# Patient Record
Sex: Female | Born: 1970 | Hispanic: Yes | Marital: Single | Smoking: Former smoker
Health system: Southern US, Community
[De-identification: ages and names within clinical notes are randomized; demographics above are authoritative.]

## PROBLEM LIST (undated history)

## (undated) DIAGNOSIS — M109 Gout, unspecified: Secondary | ICD-10-CM

## (undated) DIAGNOSIS — D649 Anemia, unspecified: Secondary | ICD-10-CM

## (undated) HISTORY — DX: Gout, unspecified: M10.9

## (undated) HISTORY — PX: TUBAL LIGATION: SHX77

## (undated) HISTORY — DX: Anemia, unspecified: D64.9

---

## 2005-10-11 ENCOUNTER — Emergency Department (HOSPITAL_COMMUNITY): Admission: EM | Admit: 2005-10-11 | Discharge: 2005-10-11 | Payer: Self-pay | Admitting: Emergency Medicine

## 2008-10-12 ENCOUNTER — Ambulatory Visit: Payer: Self-pay | Admitting: Internal Medicine

## 2008-10-18 ENCOUNTER — Ambulatory Visit: Payer: Self-pay | Admitting: *Deleted

## 2008-11-02 ENCOUNTER — Encounter: Payer: Self-pay | Admitting: Family Medicine

## 2008-11-02 ENCOUNTER — Ambulatory Visit: Payer: Self-pay | Admitting: Internal Medicine

## 2008-11-02 LAB — CONVERTED CEMR LAB
ALT: 12 units/L (ref 0–35)
AST: 16 units/L (ref 0–37)
Albumin: 4 g/dL (ref 3.5–5.2)
Alkaline Phosphatase: 81 units/L (ref 39–117)
BUN: 17 mg/dL (ref 6–23)
Basophils Absolute: 0 10*3/uL (ref 0.0–0.1)
Basophils Relative: 1 % (ref 0–1)
CO2: 23 meq/L (ref 19–32)
Calcium: 8.7 mg/dL (ref 8.4–10.5)
Chloride: 107 meq/L (ref 96–112)
Cholesterol: 141 mg/dL (ref 0–200)
Creatinine, Ser: 0.61 mg/dL (ref 0.40–1.20)
Eosinophils Absolute: 0.1 10*3/uL (ref 0.0–0.7)
Eosinophils Relative: 1 % (ref 0–5)
Glucose, Bld: 97 mg/dL (ref 70–99)
HCT: 36.4 % (ref 36.0–46.0)
HDL: 58 mg/dL (ref >39)
Hemoglobin: 11.6 g/dL — ABNORMAL LOW (ref 12.0–15.0)
LDL Cholesterol: 70 mg/dL (ref 0–99)
Lymphocytes Relative: 32 % (ref 12–46)
Lymphs Abs: 2.4 10*3/uL (ref 0.7–4.0)
MCHC: 31.9 g/dL (ref 30.0–36.0)
MCV: 88.1 fL (ref 78.0–100.0)
Monocytes Absolute: 0.6 10*3/uL (ref 0.1–1.0)
Monocytes Relative: 7 % (ref 3–12)
Neutro Abs: 4.4 10*3/uL (ref 1.7–7.7)
Neutrophils Relative %: 59 % (ref 43–77)
Platelets: 317 10*3/uL (ref 150–400)
Potassium: 4.1 meq/L (ref 3.5–5.3)
RBC: 4.13 M/uL (ref 3.87–5.11)
RDW: 14.2 % (ref 11.5–15.5)
Sodium: 139 meq/L (ref 135–145)
TSH: 0.657 microintl units/mL (ref 0.350–4.50)
Total Bilirubin: 0.4 mg/dL (ref 0.3–1.2)
Total CHOL/HDL Ratio: 2.4
Total Protein: 6.9 g/dL (ref 6.0–8.3)
Triglycerides: 66 mg/dL (ref <150)
VLDL: 13 mg/dL (ref 0–40)
WBC: 7.4 10*3/uL (ref 4.0–10.5)

## 2008-12-04 ENCOUNTER — Ambulatory Visit: Payer: Self-pay | Admitting: Family Medicine

## 2009-04-12 ENCOUNTER — Ambulatory Visit: Payer: Self-pay | Admitting: Internal Medicine

## 2015-07-18 ENCOUNTER — Emergency Department (HOSPITAL_COMMUNITY)
Admission: EM | Admit: 2015-07-18 | Discharge: 2015-07-18 | Disposition: A | Discharge status: Home / Self Care | Payer: Self-pay | Attending: Emergency Medicine | Admitting: Emergency Medicine

## 2015-07-18 ENCOUNTER — Encounter (HOSPITAL_COMMUNITY): Payer: Self-pay | Admitting: Emergency Medicine

## 2015-07-18 DIAGNOSIS — H6591 Unspecified nonsuppurative otitis media, right ear: Secondary | ICD-10-CM | POA: Insufficient documentation

## 2015-07-18 DIAGNOSIS — H65192 Other acute nonsuppurative otitis media, left ear: Secondary | ICD-10-CM | POA: Insufficient documentation

## 2015-07-18 DIAGNOSIS — J0100 Acute maxillary sinusitis, unspecified: Secondary | ICD-10-CM | POA: Insufficient documentation

## 2015-07-18 DIAGNOSIS — R0981 Nasal congestion: Secondary | ICD-10-CM | POA: Insufficient documentation

## 2015-07-18 MED ORDER — OXYCODONE-ACETAMINOPHEN 5-325 MG PO TABS
ORAL_TABLET | ORAL | Status: AC
  Filled 2015-07-18: qty 1

## 2015-07-18 MED ORDER — AMOXICILLIN-POT CLAVULANATE 875-125 MG PO TABS: 1.0000 | ORAL_TABLET | Freq: Two times a day (BID) | ORAL | Status: DC

## 2015-07-18 MED ORDER — PSEUDOEPHEDRINE HCL ER 120 MG PO TB12: 120.0000 mg | ORAL_TABLET | Freq: Two times a day (BID) | ORAL | Status: DC

## 2015-07-18 MED ORDER — OXYCODONE-ACETAMINOPHEN 5-325 MG PO TABS
1.0000 | ORAL_TABLET | Freq: Once | ORAL | Status: AC
  Administered 2015-07-18: 1 via ORAL

## 2015-07-18 MED ORDER — AMOXICILLIN-POT CLAVULANATE 875-125 MG PO TABS
1.0000 | ORAL_TABLET | Freq: Once | ORAL | Status: AC
  Administered 2015-07-18: 1 via ORAL
  Filled 2015-07-18: qty 1

## 2015-07-18 MED ORDER — PSEUDOEPHEDRINE HCL ER 120 MG PO TB12
120.0000 mg | ORAL_TABLET | Freq: Once | ORAL | Status: AC
  Administered 2015-07-18: 120 mg via ORAL
  Filled 2015-07-18: qty 1

## 2015-07-18 NOTE — ED Notes (Signed)
Pt. reports left ear ache onset yesterday , denies injury , no drainage / no hearing loss.

## 2015-07-18 NOTE — ED Provider Notes (Signed)
CSN: [id number]     Arrival date & time 07/18/15  0155 History   First MD Initiated Contact with Patient 07/18/15 [phone]     Chief Complaint  Patient presents with  . Otalgia     (Consider location/radiation/quality/duration/timing/severity/associated sxs/prior Treatment) HPI Comments: Yesterday patient started with nasal congestion, more prominent on the left than right.  Left ear pain.  She has taken Tylenol without any relief.  Denies any fever or sore throat  Patient is a 44 y.o. female presenting with ear pain. The history is provided by the patient. The history is limited by a language barrier. A language interpreter was used.  Otalgia Location:  Right Behind ear:  No abnormality Quality:  Aching Severity:  Moderate Onset quality:  Gradual Duration:  1 day Timing:  Constant Progression:  Worsening Chronicity:  New Relieved by:  Nothing Worsened by:  Nothing tried Ineffective treatments:  None tried Associated symptoms: congestion   Associated symptoms: no ear discharge, no fever, no headaches, no hearing loss, no neck pain, no rash, no rhinorrhea, no sore throat and no tinnitus   Congestion:    Location:  Nasal   History reviewed. No pertinent past medical history. History reviewed. No pertinent past surgical history. No family history on file. History  Substance Use Topics  . Smoking status: Never Smoker   . Smokeless tobacco: Not on file  . Alcohol Use: No   OB History    No data available     Review of Systems  Constitutional: Negative for fever.  HENT: Positive for congestion, ear pain and sinus pressure. Negative for ear discharge, facial swelling, hearing loss, rhinorrhea, sore throat and tinnitus.   Musculoskeletal: Negative for neck pain.  Skin: Negative for rash.  Neurological: Negative for headaches.  All other systems reviewed and are negative.     Allergies  Review of patient's allergies indicates no known allergies.  Home Medications    Prior to Admission medications   Medication Sig Start Date End Date Taking? Authorizing Provider  amoxicillin-clavulanate (AUGMENTIN) 875-125 MG per tablet Take 1 tablet by mouth 2 (two) times daily. 07/18/15   Junius Creamer, NP  pseudoephedrine (SUDAFED) 120 MG 12 hr tablet Take 1 tablet (120 mg total) by mouth 2 (two) times daily. 07/18/15   Junius Creamer, NP   BP 146/72 mmHg  Pulse 103  Temp(Src) 99.2 F (37.3 C) (Oral)  Resp 20  SpO2 99%  LMP 07/15/2015 Physical Exam  Constitutional: She appears well-developed and well-nourished. No distress.  HENT:  Head: Normocephalic and atraumatic.  Right Ear: External ear normal. Tympanic membrane is bulging. A middle ear effusion is present.  Nose: No rhinorrhea. Left sinus exhibits maxillary sinus tenderness and frontal sinus tenderness.  Cardiovascular: Normal rate.   Musculoskeletal: Normal range of motion.  Lymphadenopathy:    She has no cervical adenopathy.  Neurological: She is alert.  Skin: Skin is warm and dry.  Nursing note and vitals reviewed.   ED Course  Procedures (including critical care time) Labs Review Labs Reviewed - No data to display  Imaging Review No results found.   EKG Interpretation None     patient has left frontal and maxillary sinus tenderness and left otitis will be treated with Augmentin and Sudafed  MDM   Final diagnoses:  Acute maxillary sinusitis, recurrence not specified  Other acute nonsuppurative otitis media of left ear           Junius Creamer, NP 07/18/15 2585  Linton Flemings, MD 07/18/15 [phone]

## 2015-07-20 ENCOUNTER — Ambulatory Visit (INDEPENDENT_AMBULATORY_CARE_PROVIDER_SITE_OTHER): Payer: Self-pay | Admitting: Urgent Care

## 2015-07-20 VITALS — BP 106/74 | HR 96 | Temp 99.0°F | Resp 18 | Ht 62.0 in | Wt 165.0 lb

## 2015-07-20 DIAGNOSIS — H60392 Other infective otitis externa, left ear: Secondary | ICD-10-CM

## 2015-07-20 DIAGNOSIS — H60399 Other infective otitis externa, unspecified ear: Secondary | ICD-10-CM | POA: Insufficient documentation

## 2015-07-20 DIAGNOSIS — H9202 Otalgia, left ear: Secondary | ICD-10-CM

## 2015-07-20 MED ORDER — CIPROFLOXACIN HCL 500 MG PO TABS
500.0000 mg | ORAL_TABLET | Freq: Two times a day (BID) | ORAL | Status: DC
Start: 2015-07-20 — End: 2016-05-08

## 2015-07-20 MED ORDER — NEOMYCIN-POLYMYXIN-HC 3.5-10000-1 OT SOLN: 3.0000 [drp] | Freq: Four times a day (QID) | OTIC | Status: DC

## 2015-07-20 NOTE — Progress Notes (Signed)
    MRN: [id number] DOB: 09-26-1971  Subjective:   Madison Thompson is a 44 y.o. female presenting for chief complaint of Ear Pain  Reports ~1 week history of left ear pain. Patient was seen at [facility] ED on 07/18/2015, diagnosed with sinusitis and otitis media, started on Augmentin and APAP-cod for pain and inflammation. Her left ear pain has since gotten worse and is draining, has left sided neck pain and swollen lymph nodes, external ear pain, started having nausea yesterday. Of note, patient went swimming at a pool ~1.5-2 weeks ago and ear pain started shortly thereafter. Denies fever, sore throat, cough, eye pain, tooth pain. Denies any other aggravating or relieving factors, no other questions or concerns.  Madison Thompson has a current medication list which includes the following prescription(s): acetaminophen-codeine, amoxicillin-clavulanate, and pseudoephedrine. She has No Known Allergies.  Madison Thompson  has a past medical history of Anemia. Also  has no past surgical history on file.  ROS As in subjective.  Objective:   Vitals: BP 106/74 mmHg  Pulse 96  Temp(Src) 99 F (37.2 C)  Resp 18  Ht 5\' 2"  (1.575 m)  Wt 165 lb (74.844 kg)  BMI 30.17 kg/m2  SpO2 98%  LMP 07/15/2015  Physical Exam  Constitutional: She appears well-developed and well-nourished.  HENT:  Left ear canal with white clumps of discharge, effusion present but TM without erythema. Nasal turbinates pink and moist. Left maxillary sinus tenderness. Throat without oropharyngeal exudates, erythema or abscesses. Dentition in good repair. No areas of fluctuance or swelling along gumlines or signs of infection of teeth.  Neck: Normal range of motion. Neck supple.  Cardiovascular: Normal rate, regular rhythm and intact distal pulses.  Exam reveals no gallop and no friction rub.   No murmur heard. Pulmonary/Chest: No respiratory distress. She has no wheezes. She has no rales.  Lymphadenopathy:    She has cervical  adenopathy (anterior, left-sided, tender on palpation).  Skin: Skin is warm and dry. No rash noted. No erythema. No pallor.    Assessment and Plan :   1. Left ear pain 2. Otitis, externa, infective, left - Will cover patient for otitis externa with Ciprofloxacin x10 days and cortisporin ear drops (QID x7 days). Patient is rtc in 2 days for follow up. Consider mastoiditis, CT scan if no improvement, may also refer to ENT for more emergent evaluation.  Jaynee Eagles, PA-C [facility] [phone] 07/20/2015 12:18 PM

## 2015-07-20 NOTE — Patient Instructions (Signed)
Otitis externa (Otitis Externa) La otitis externa es una infeccin bacteriana o por hongos en el conducto auditivo externo. Esta es el rea desde el tmpano hasta el exterior de la [place]. Tambin se llama "odo de nadador". CAUSAS  Las posibles causas de la infeccin son:  Tonye Becket en agua sucia.  Humedad que queda en el odo despus de nadar o baarse.  Lesin leve en el odo (traumatismo).  Objetos atascados en el odo (cuerpo extrao).  Cortes o raspones (abrasiones) en la parte exterior del odo. SIGNOS Y SNTOMAS  En general, el primer sntoma de infeccin es la picazn en el canal auditivo. Ms tarde, los signos y los sntomas pueden ser hinchazn y enrojecimiento del conducto auditivo, dolor de odo y supuracin de lquido de color blanco amarillento (pus). El Social research officer, government de odo puede empeorar cuando tira el lbulo de la [place]. DIAGNSTICO  Su mdico le Chartered certified accountant examen fsico. Podr tomar Truddie Coco de lquido de la oreja y Hydrographic surveyor bacterias u hongos. TRATAMIENTO  Las gotas antibiticas para los odos se administran generalmente durante 10 a 14 das. El tratamiento tambin puede incluir analgsicos o corticoides para reducir la comezn y la hinchazn. INSTRUCCIONES PARA EL CUIDADO EN EL HOGAR   Aplique gotas de antibitico en el conducto auditivo segn lo indicado por el mdico.  Tome los medicamentos solamente como se lo haya indicado el mdico.  Si tiene diabetes, siga las instrucciones adicionales de tratamiento que le haya dado el mdico.  Consulting civil engineer a todas las visitas de control como se lo haya indicado el mdico. PREVENCIN   Mantenga el odo seco. Use la punta de una toalla para absorber el agua del canal auditivo despus de nadar o del bao.  Evite rascarse o poner objetos en el interior del odo. Esto puede daar el conducto auditivo o eliminar la cera protectora que recubre el conducto. Esto facilita el crecimiento de las bacterias y hongos.  [employer], en agua contaminada, o en las piscinas mal cloradas.  Puede usar gotas para los odos hechas de alcohol y vinagre despus de nadar. Mezcle en partes iguales el vinagre blanco y el alcohol en una botella. Ponga 3 o 4 gotas en cada odo despus de nadar. SOLICITE ATENCIN MDICA SI:   Jaclynn Guarneri.  Su odo contina rojo, hinchado, le duele o supura pus despus de 3 das.  El dolor, la hinchazn o el enrojecimiento empeoran.  Sufre un dolor intenso de [place].  Tiene en la zona detrs de la oreja roja, hinchada, le duele o est sensible. ASEGRESE DE QUE:   Comprende estas instrucciones.  Controlar su afeccin.  Recibir ayuda de inmediato si no mejora o si empeora. Document Released: 12/15/2005 Document Revised: 05/01/2014 [facility] Patient Information 2015 [place], [place]. This information is not intended to replace advice given to you by your health care provider. Make sure you discuss any questions you have with your health care provider.

## 2016-05-08 ENCOUNTER — Ambulatory Visit (INDEPENDENT_AMBULATORY_CARE_PROVIDER_SITE_OTHER): Payer: Self-pay | Admitting: Physician Assistant

## 2016-05-08 ENCOUNTER — Ambulatory Visit (INDEPENDENT_AMBULATORY_CARE_PROVIDER_SITE_OTHER): Payer: Self-pay

## 2016-05-08 VITALS — BP 126/68 | HR 74 | Temp 98.4°F | Resp 16 | Ht 61.0 in | Wt 157.8 lb

## 2016-05-08 DIAGNOSIS — M542 Cervicalgia: Secondary | ICD-10-CM

## 2016-05-08 DIAGNOSIS — R202 Paresthesia of skin: Secondary | ICD-10-CM

## 2016-05-08 DIAGNOSIS — Z3202 Encounter for pregnancy test, result negative: Secondary | ICD-10-CM

## 2016-05-08 LAB — POCT CBC
Granulocyte percent: 60.8 %G (ref 37–80)
HCT, POC: 31.3 % — AB (ref 37.7–47.9)
Hemoglobin: 10.5 g/dL — AB (ref 12.2–16.2)
Lymph, poc: 3.1 (ref 0.6–3.4)
MCH, POC: 25.6 pg — AB (ref 27–31.2)
MCHC: 33.5 g/dL (ref 31.8–35.4)
MCV: 76.4 fL — AB (ref 80–97)
MID (cbc): 0.8 (ref 0–0.9)
MPV: 7.4 fL (ref 0–99.8)
POC Granulocyte: 6 (ref 2–6.9)
POC LYMPH PERCENT: 31.2 %L (ref 10–50)
POC MID %: 8 %M (ref 0–12)
Platelet Count, POC: 318 10*3/uL (ref 142–424)
RBC: 4.1 M/uL (ref 4.04–5.48)
RDW, POC: 16 %
WBC: 9.9 10*3/uL (ref 4.6–10.2)

## 2016-05-08 LAB — POCT GLYCOSYLATED HEMOGLOBIN (HGB A1C): Hemoglobin A1C: 5.5

## 2016-05-08 LAB — POCT URINE PREGNANCY: Preg Test, Ur: NEGATIVE

## 2016-05-08 MED ORDER — PREDNISONE 20 MG PO TABS: ORAL_TABLET | ORAL | Status: AC

## 2016-05-08 MED ORDER — CYCLOBENZAPRINE HCL 10 MG PO TABS: 5.0000 mg | ORAL_TABLET | Freq: Three times a day (TID) | ORAL | Status: DC | PRN

## 2016-05-08 NOTE — Patient Instructions (Signed)
     IF you received an x-ray today, you will receive an invoice from [facility]. Please contact [facility] at [phone] with questions or concerns regarding your invoice.   IF you received labwork today, you will receive an invoice from [facility]. Please contact [facility] at [phone] with questions or concerns regarding your invoice.   Our billing staff will not be able to assist you with questions regarding bills from these companies.  You will be contacted with the lab results as soon as they are available. The fastest way to get your results is to activate your My Chart account. Instructions are located on the last page of this paperwork. If you have not heard from us regarding the results in 2 weeks, please contact this office.      

## 2016-05-08 NOTE — Progress Notes (Signed)
05/14/2016 11:31 AM   DOB: [birth year] / MRN: [id number]  SUBJECTIVE:  Madison Thompson is a 45 y.o. female presenting for back pain and neck that is worsening. She describes the pain as "strong" and she is unable to lift her head.  She complains that her right hand is hurting some today.  She denies weakness in her arms. She denies weakness and numbness in her legs. She has had these symptoms in the past and reports she was told she had "too much stress" and was not given any medication.    She has No Known Allergies.   She  has a past medical history of Anemia.    She  reports that she has never smoked. She does not have any smokeless tobacco history on file. She reports that she does not drink alcohol or use illicit drugs. She  has no sexual activity history on file. The patient  has no past surgical history on file.  Her family history is not on file.  Review of Systems  Constitutional: Negative for fever and chills.  Respiratory: Negative for cough.   Cardiovascular: Negative for chest pain.  Gastrointestinal: Negative for nausea.  Musculoskeletal: Positive for myalgias and neck pain. Negative for back pain, joint pain and falls.  Skin: Negative for rash.  Neurological: Positive for headaches. Negative for dizziness.    Problem list and medications reviewed and updated by myself where necessary, and exist elsewhere in the encounter.   OBJECTIVE:  BP 126/68 mmHg  Pulse 74  Temp(Src) 98.4 F (36.9 C) (Oral)  Resp 16  Ht 5\' 1"  (1.549 m)  Wt 157 lb 12.8 oz (71.578 kg)  BMI 29.83 kg/m2  SpO2 98%  LMP 03/08/2016  Physical Exam  Neurological: She has normal strength. She displays no atrophy and no tremor. A sensory deficit is present. No cranial nerve deficit. She exhibits normal muscle tone. She displays a negative Romberg sign. Coordination and gait normal. GCS eye subscore is 4. GCS verbal subscore is 5. GCS motor subscore is 6.  Reflex Scores:      Tricep reflexes are  2+ on the right side and 2+ on the left side.      Bicep reflexes are 2+ on the right side and 2+ on the left side.      Brachioradialis reflexes are 2+ on the right side and 2+ on the left side.      Patellar reflexes are 2+ on the right side and 2+ on the left side.      Achilles reflexes are 2+ on the right side and 2+ on the left side. Decreased sensation to light touch about the C6-C7 dermatome.     No results found for this or any previous visit (from the past 72 hour(s)).  No results found.  ASSESSMENT AND PLAN  Madison Thompson was seen today for neck and left shoulder pain and headache.  Diagnoses and all orders for this visit:  Neck pain -     DG Cervical Spine Complete; Future -     POCT urine pregnancy -     cyclobenzaprine (FLEXERIL) 10 MG tablet; Take 0.5-1 tablets (5-10 mg total) by mouth 3 (three) times daily as needed for muscle spasms (May cause drowsiness. Do no operate heavy machinery while taking.).  Paresthesia of right arm -     POCT glycosylated hemoglobin (Hb A1C) -     POCT CBC -     predniSONE (DELTASONE) 20 MG tablet; Take 3 in the morning for  3 days, then 2 in the morning for 3 days, and then 1 in the morning for 3 days.    The patient was advised to call or return to clinic if she does not see an improvement in symptoms or to seek the care of the closest emergency department if she worsens with the above plan.   Philis Fendt, MHS, PA-C [facility] 05/14/2016 11:31 AM

## 2016-12-11 ENCOUNTER — Other Ambulatory Visit: Payer: Self-pay | Admitting: Obstetrics and Gynecology

## 2016-12-11 DIAGNOSIS — Z1231 Encounter for screening mammogram for malignant neoplasm of breast: Secondary | ICD-10-CM

## 2017-01-06 ENCOUNTER — Ambulatory Visit
Admission: RE | Admit: 2017-01-06 | Discharge: 2017-01-06 | Disposition: A | Discharge status: Home / Self Care | Payer: No Typology Code available for payment source | Source: Ambulatory Visit | Attending: Obstetrics and Gynecology | Admitting: Obstetrics and Gynecology

## 2017-01-06 ENCOUNTER — Ambulatory Visit (HOSPITAL_COMMUNITY)
Admission: RE | Admit: 2017-01-06 | Discharge: 2017-01-06 | Disposition: A | Discharge status: Home / Self Care | Payer: Self-pay | Source: Ambulatory Visit | Attending: Obstetrics and Gynecology | Admitting: Obstetrics and Gynecology

## 2017-01-06 ENCOUNTER — Encounter (HOSPITAL_COMMUNITY): Payer: Self-pay

## 2017-01-06 VITALS — BP 118/78 | Temp 97.7°F | Ht 63.0 in | Wt 154.2 lb

## 2017-01-06 DIAGNOSIS — Z1239 Encounter for other screening for malignant neoplasm of breast: Secondary | ICD-10-CM

## 2017-01-06 DIAGNOSIS — Z1231 Encounter for screening mammogram for malignant neoplasm of breast: Secondary | ICD-10-CM

## 2017-01-06 NOTE — Progress Notes (Signed)
No complaints today.   Pap Smear: Pap smear not completed today. Last Pap smear was more than 10 years ago and normal per patient. Unable to complete Pap smear since patient is currently on menstrual period. Pap smear is scheduled with [facility] clinic on Thursday, January 22, 2017 at 1530. Per patient has no history of an abnormal Pap smear. No Pap smear results are in EPIC.  Physical exam: Breasts Breasts symmetrical. No skin abnormalities bilateral breasts. Bilateral nipple retraction that per patient is normal for her. No nipple discharge bilateral breasts. No lymphadenopathy. No lumps palpated bilateral breasts. No complaints of pain or tenderness on exam. Referred patient to the [facility] for a screening mammogram. Appointment scheduled for Tuesday, January 06, 2017 at 1400.        Pelvic/Bimanual No Pap smear completed today since last patient is currently on her menstrual period.  Smoking History: Patient has never smoked.  Patient Navigation: Patient education provided. Access to services provided for patient through [facility] program. Spanish interpreter provided.  Used Spanish interpreter Scarlette Calico from [employer].

## 2017-01-06 NOTE — Patient Instructions (Signed)
Explained breast self awareness with Maricella Pereira. Unable to complete Pap smear since patient is currently on menstrual period. Pap smear is scheduled with [facility] clinic on Thursday, January 22, 2017 at 1530. Referred patient to the [facility] for a screening mammogram. Appointment scheduled for Tuesday, January 06, 2017 at 1400. Let [facility] know the [facility] will follow up with her within the next couple weeks with results of mammogram by letter or phone. [place] verbalized understanding.  Valine Drozdowski, Arvil Chaco, RN 2:13 PM

## 2017-01-07 ENCOUNTER — Encounter (HOSPITAL_COMMUNITY): Payer: Self-pay | Admitting: *Deleted

## 2017-01-22 ENCOUNTER — Encounter (HOSPITAL_COMMUNITY): Payer: Self-pay

## 2017-01-22 ENCOUNTER — Ambulatory Visit (HOSPITAL_COMMUNITY)
Admission: RE | Admit: 2017-01-22 | Discharge: 2017-01-22 | Disposition: A | Discharge status: Home / Self Care | Payer: Self-pay | Source: Ambulatory Visit | Attending: Obstetrics and Gynecology | Admitting: Obstetrics and Gynecology

## 2017-01-22 VITALS — BP 120/78 | Temp 97.9°F | Ht 63.0 in | Wt 158.0 lb

## 2017-01-22 DIAGNOSIS — Z01419 Encounter for gynecological examination (general) (routine) without abnormal findings: Secondary | ICD-10-CM

## 2017-01-22 DIAGNOSIS — N898 Other specified noninflammatory disorders of vagina: Secondary | ICD-10-CM

## 2017-01-22 NOTE — Patient Instructions (Signed)
Explained to [facility] that [facility] will cover Pap smears and HPV typing every 5 years unless has a history of abnormal Pap smears. Reminded patient that she will need a screening mammogram in one year. Let patient know will follow up with her within the next couple days with wet prep results and within a couple of weeks with results to Pap smear by phone. [place] verbalized understanding.  Lauralie Blacksher, Arvil Chaco, RN 3:31 PM

## 2017-01-22 NOTE — Progress Notes (Signed)
No complaints today.   Pap Smear: Pap smear not completed today. Last Pap smear was more than 10 years ago and normal per patient. Per patient has no history of an abnormal Pap smear. No Pap smear results are in EPIC  Pelvic/Bimanual   Ext Genitalia No lesions, no swelling and no discharge observed on external genitalia.         Vagina Vagina pink and normal texture. No lesions and thick white vaginal discharge observed in vagina. Wet prep completed.         Cervix Cervix is present. Cervix pink and of normal texture. Cervix friable. Thick white discharge observed on cervix.     Uterus Uterus is present and palpable. Uterus in normal position and normal size.        Adnexae Bilateral ovaries present and palpable. No tenderness on palpation.          Rectovaginal No rectal exam completed today since patient had no rectal complaints. No skin abnormalities observed on exam.    Smoking History: Patient has never smoked.  Patient Navigation: Patient education provided. Access to services provided for patient through [facility] program. Spanish interpreter provided.  Used Spanish interpreter [employer] from [place].

## 2017-01-23 ENCOUNTER — Other Ambulatory Visit (HOSPITAL_COMMUNITY): Payer: Self-pay | Admitting: *Deleted

## 2017-01-23 ENCOUNTER — Telehealth (HOSPITAL_COMMUNITY): Payer: Self-pay | Admitting: *Deleted

## 2017-01-23 ENCOUNTER — Encounter (HOSPITAL_COMMUNITY): Payer: Self-pay | Admitting: *Deleted

## 2017-01-23 DIAGNOSIS — B9689 Other specified bacterial agents as the cause of diseases classified elsewhere: Secondary | ICD-10-CM

## 2017-01-23 DIAGNOSIS — N760 Acute vaginitis: Principal | ICD-10-CM

## 2017-01-23 LAB — CYTOLOGY - PAP
Diagnosis: NEGATIVE
HPV: NOT DETECTED

## 2017-01-23 MED ORDER — METRONIDAZOLE 500 MG PO TABS: 500.0000 mg | ORAL_TABLET | Freq: Two times a day (BID) | ORAL | 0 refills | Status: DC

## 2017-01-23 NOTE — Telephone Encounter (Signed)
Telephoned patient at home number. Advised patient wet prep results did show bacterial vaginosis. Medication was called into [employer] at [facility]. Advised patient to finish all medication and no alcohol while taking medication. Patient voiced understanding. Used interpreter Lavon Paganini.

## 2017-01-27 ENCOUNTER — Telehealth (HOSPITAL_COMMUNITY): Payer: Self-pay | Admitting: *Deleted

## 2017-01-27 NOTE — Telephone Encounter (Signed)
Telephoned patient at home number and advised patient of negative pap smear results. HPV was negative. Next pap smear due in five years. Patient voiced understanding. Patient voiced understanding. Used interpreter Lavon Paganini.

## 2018-01-01 ENCOUNTER — Other Ambulatory Visit: Payer: Self-pay | Admitting: Obstetrics and Gynecology

## 2018-01-01 DIAGNOSIS — Z1231 Encounter for screening mammogram for malignant neoplasm of breast: Secondary | ICD-10-CM

## 2018-01-26 ENCOUNTER — Ambulatory Visit (HOSPITAL_COMMUNITY)
Admission: RE | Admit: 2018-01-26 | Discharge: 2018-01-26 | Disposition: A | Discharge status: Home / Self Care | Payer: No Typology Code available for payment source | Source: Ambulatory Visit | Attending: Obstetrics and Gynecology | Admitting: Obstetrics and Gynecology

## 2018-01-26 ENCOUNTER — Ambulatory Visit
Admission: RE | Admit: 2018-01-26 | Discharge: 2018-01-26 | Disposition: A | Discharge status: Home / Self Care | Payer: No Typology Code available for payment source | Source: Ambulatory Visit | Attending: Obstetrics and Gynecology | Admitting: Obstetrics and Gynecology

## 2018-01-26 ENCOUNTER — Encounter (HOSPITAL_COMMUNITY): Payer: Self-pay

## 2018-01-26 VITALS — BP 122/78 | Wt 161.0 lb

## 2018-01-26 DIAGNOSIS — Z1231 Encounter for screening mammogram for malignant neoplasm of breast: Secondary | ICD-10-CM

## 2018-01-26 DIAGNOSIS — Z1239 Encounter for other screening for malignant neoplasm of breast: Secondary | ICD-10-CM

## 2018-01-26 NOTE — Progress Notes (Signed)
No complaints today.   Pap Smear:  Pap smear not completed today. Last Pap smear was 01/22/17 at [facility] and normal with negative HPV. Per patient has no history of an abnormal Pap smear.  Physical exam: Breasts Breasts symmetrical. No skin abnormalities bilateral breasts. Bilateral nipple retraction that per patient is normal for her. No nipple discharge bilateral breasts. No lymphadenopathy. No lumps palpated bilateral breasts. No complaints of pain or tenderness on exam. Referred patient to the [facility] for a screening mammogram. Appointment scheduled for Tuesday, January 26, 2018 at 1600.       Pelvic/Bimanual No Pap smear completed today since last Pap smear and HPV typing was 01/22/2017. Pap smear not indicated per [facility] guidelines.   Smoking History: Patient has never smoked.  Patient Navigation: Patient education provided. Access to services provided for patient through [facility] program. Spanish interpreter provided.  Used Spanish interpreter [employer] from [place].

## 2018-01-26 NOTE — Patient Instructions (Signed)
Explained breast self awareness with [facility]. Patient did not need a Pap smear today due to last Pap smear and HPV typing was 01/22/2017. Let her know [facility] will cover Pap smears and HPV typing every 5 years unless has a history of abnormal Pap smears. Referred patient to the [facility] for a screening mammogram. Appointment scheduled for Tuesday, January 26, 2018 at 1600. Patient aware of appointment and will be there. Let patient know the [facility] will follow up with her within the next couple weeks with results of mammogram by letter or phone. [place] verbalized understanding.  Abhi Moccia, Arvil Chaco, RN 2:15 PM

## 2018-01-27 ENCOUNTER — Encounter (HOSPITAL_COMMUNITY): Payer: Self-pay | Admitting: *Deleted

## 2019-12-30 DIAGNOSIS — U071 COVID-19: Secondary | ICD-10-CM

## 2019-12-30 HISTORY — DX: COVID-19: U07.1

## 2021-02-28 ENCOUNTER — Encounter: Payer: Self-pay | Admitting: Nurse Practitioner

## 2021-03-11 ENCOUNTER — Ambulatory Visit (INDEPENDENT_AMBULATORY_CARE_PROVIDER_SITE_OTHER): Payer: Self-pay | Admitting: Obstetrics & Gynecology

## 2021-03-11 ENCOUNTER — Other Ambulatory Visit: Payer: Self-pay

## 2021-03-11 ENCOUNTER — Encounter: Payer: Self-pay | Admitting: Obstetrics & Gynecology

## 2021-03-11 VITALS — BP 120/84 | Ht 61.0 in | Wt 173.0 lb

## 2021-03-11 DIAGNOSIS — R1900 Intra-abdominal and pelvic swelling, mass and lump, unspecified site: Secondary | ICD-10-CM

## 2021-03-11 NOTE — Progress Notes (Signed)
    [place] 05-20-1971 [id number]        50 y.o.  [id number]   RP: Pelvic Mass on Pelvic US  HPI: Abdominopelvic pressure.  Presented because had difficulty controlling her urine.  Better on medication.  Sexually active, occasionally having discomfort with IC.   OB History  Gravida Para Term Preterm AB Living  3 3 3     3   SAB IAB Ectopic Multiple Live Births          3    # Outcome Date GA Lbr Len/2nd Weight Sex Delivery Anes PTL Lv  3 Term           2 Term           1 Term             Past medical history,surgical history, problem list, medications, allergies, family history and social history were all reviewed and documented in the EPIC chart.   Directed ROS with pertinent positives and negatives documented in the history of present illness/assessment and plan.  Exam:  Vitals:   03/11/21 1051  BP: 120/84  Weight: 173 lb (78.5 kg)  Height: 5\' 1"  (1.549 m)   General appearance:  Normal  Abdomen: Normal, not distended, NT.  Gynecologic exam: Vulva normal.  Bimanual exam:  Uterus increased in volume, nodular, mobile.  No adnexal mass felt, NT.  Pelvic US on February 11, 2021: 11.3 x 9.4 x 8.6 cm complicated solid mass likely arising from the posterior uterus.  Left ovary normal.  Right ovary not visualized.  No other pelvic findings reported.   Assessment/Plan:  50 y.o. G3P3003   1. 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  Schedule abdominopelvic MRI with contrast.  Will do tumor markers as well.  F/U in 2 weeks. - Lactate dehydrogenase - CA 125 - [facility] - MR ANGIO ABDOMEN PELVIS W CONTRAST; Future  Other orders - oxybutynin (DITROPAN) 5 MG tablet; Take 5 mg by mouth 3 (three) times daily.  Princess Bruins MD, 11:29 AM 03/11/2021

## 2021-03-12 ENCOUNTER — Telehealth: Payer: Self-pay | Admitting: *Deleted

## 2021-03-12 DIAGNOSIS — R1900 Intra-abdominal and pelvic swelling, mass and lump, unspecified site: Secondary | ICD-10-CM

## 2021-03-12 LAB — CA 125: CA 125: 15 U/mL (ref <35)

## 2021-03-12 LAB — LACTATE DEHYDROGENASE: LDH: 139 U/L (ref 120–250)

## 2021-03-12 LAB — CEA: CEA: <0.5 ng/mL

## 2021-03-12 NOTE — Telephone Encounter (Signed)
-----   Message from Princess Bruins, MD sent at 03/11/2021 11:35 AM EDT ----- Regarding: Schedule MRI of abdomen and pelvis Large heterogeneous mass 9.4 x 8.6 x 11.3 cm from or adjacent to the posterior uterus.  Rt Ovary not seen by [place].  MRI of abdomen and pelvis asap  F/U scheduled with me in 2 weeks

## 2021-03-12 NOTE — Telephone Encounter (Signed)
I called [facility] and regarding scheduling they have to speak with patient to discuss screening questions. Spanish interpreter will call to schedule.

## 2021-03-18 ENCOUNTER — Encounter: Payer: Self-pay | Admitting: Obstetrics & Gynecology

## 2021-03-20 NOTE — Telephone Encounter (Signed)
[facility] has left message for patient to call with interpreter

## 2021-03-21 NOTE — Telephone Encounter (Signed)
Dr.Lavoie patient MRI's scheduled on 04/08/21 & 04/11/21 they have to do 2 separate Mri's due to abdomen and pelvis your ordered.  You wanted patient to follow up with you in 2 weeks, and she is scheduled on 04/05/21. This is the next available appointment based [place] and patient work schedule.  Should we reschedule her follow up visit until after the MRI scan?

## 2021-03-22 NOTE — Telephone Encounter (Signed)
Yes, schedule visit with me after 4/14th.

## 2021-03-22 NOTE — Telephone Encounter (Signed)
Appointment rescheduled for April 27.

## 2021-03-22 NOTE — Telephone Encounter (Signed)
Rosemarie Ax can you call and reschedule the 04/05/21 appointment for patient until after 04/11/21 per the below.

## 2021-04-05 ENCOUNTER — Ambulatory Visit: Payer: Self-pay | Admitting: Obstetrics & Gynecology

## 2021-04-08 ENCOUNTER — Ambulatory Visit
Admission: RE | Admit: 2021-04-08 | Discharge: 2021-04-08 | Disposition: A | Discharge status: Home / Self Care | Payer: No Typology Code available for payment source | Source: Ambulatory Visit | Attending: Obstetrics & Gynecology | Admitting: Obstetrics & Gynecology

## 2021-04-08 ENCOUNTER — Other Ambulatory Visit: Payer: Self-pay

## 2021-04-08 DIAGNOSIS — R1900 Intra-abdominal and pelvic swelling, mass and lump, unspecified site: Secondary | ICD-10-CM

## 2021-04-08 MED ORDER — GADOBENATE DIMEGLUMINE 529 MG/ML IV SOLN
16.0000 mL | Freq: Once | INTRAVENOUS | Status: AC | PRN
  Administered 2021-04-08: 16 mL via INTRAVENOUS

## 2021-04-08 NOTE — Telephone Encounter (Signed)
Kayla called from [facility] scheduled today for MRI. Lonn [place] wanted to know if patient needs MRI abdomen patient is self pay and price is very expensive. I checked with Dr.Lavoie was told " as long as the entire pelvic mass can be seen on Mri no abdomen MRI needed" I called Kayla and relayed that informed to her as well. Lonn [place] said she will make sure the entire pelvic mass can be seen if not the will add the abdomen.

## 2021-04-11 ENCOUNTER — Other Ambulatory Visit: Payer: Self-pay

## 2021-04-11 ENCOUNTER — Ambulatory Visit
Admission: RE | Admit: 2021-04-11 | Discharge: 2021-04-11 | Disposition: A | Discharge status: Home / Self Care | Payer: No Typology Code available for payment source | Source: Ambulatory Visit | Attending: Obstetrics & Gynecology | Admitting: Obstetrics & Gynecology

## 2021-04-11 DIAGNOSIS — R1900 Intra-abdominal and pelvic swelling, mass and lump, unspecified site: Secondary | ICD-10-CM

## 2021-04-24 ENCOUNTER — Encounter: Payer: Self-pay | Admitting: Obstetrics & Gynecology

## 2021-04-24 ENCOUNTER — Ambulatory Visit (INDEPENDENT_AMBULATORY_CARE_PROVIDER_SITE_OTHER): Payer: Self-pay | Admitting: Obstetrics & Gynecology

## 2021-04-24 ENCOUNTER — Other Ambulatory Visit: Payer: Self-pay

## 2021-04-24 VITALS — BP 132/86

## 2021-04-24 DIAGNOSIS — D259 Leiomyoma of uterus, unspecified: Secondary | ICD-10-CM

## 2021-04-24 DIAGNOSIS — R1900 Intra-abdominal and pelvic swelling, mass and lump, unspecified site: Secondary | ICD-10-CM

## 2021-04-24 NOTE — Progress Notes (Signed)
Robbins [birth year] [id number]        50 y.o.  [id number]   RP: Pelvic mass for counseling on management post MRI of pelvis  HPI: Abdominopelvic pressure associated with a large pelvic mass per Pelvic US 02/11/2021.  Had initially presented because had difficulty controlling her urine.  Better on medication.  Menses with normal flow every 1-2 months.  Sexually active, occasionally having discomfort with IC.   OB History  Gravida Para Term Preterm AB Living  3 3 3     3   SAB IAB Ectopic Multiple Live Births          3    # Outcome Date GA Lbr Len/2nd Weight Sex Delivery Anes PTL Lv  3 Term           2 Term           1 Term             Past medical history,surgical history, problem list, medications, allergies, family history and social history were all reviewed and documented in the EPIC chart.   Directed ROS with pertinent positives and negatives documented in the history of present illness/assessment and plan.  Exam:  Vitals:   04/24/21 0949  BP: 132/86   General appearance:  Normal  Pelvic US on February 11, 2021: 11.3 x 9.4 x 8.6 cm complicated solid mass likely arising from the posterior uterus.  Left ovary normal.  Right ovary not visualized.  No other pelvic findings reported.  MRI 04/08/2021: Urinary Tract: The bladder is unremarkable. No bladder mass, asymmetric bladder wall thickening or calculi.  Bowel: The rectum, sigmoid colon and visualized small-bowel loops are unremarkable.  Vascular/Lymphatic: The major vascular structures are unremarkable. No aneurysm dissection. No pelvic or inguinal lymphadenopathy the.  Reproductive: The uterus is enlarged measuring approximately 15 x 9 x 10 cm. This is mainly due to a large exophytic lesion projecting off the posterior fundal region of the uterus. This lesion measures 10 x 7.5 x 7.5 cm. It has very heterogeneous areas of low T2 and high T2 signal intensity. The areas of low T2 signal  intensity demonstrate subsequent contrast enhancement. This lesion is diffusion negative and I think it is most likely a severely degenerated fibroid. Could not totally exclude the possibility of a leiomyosarcoma and a biopsy or excision may be indicated. I do not have any prior imaging studies to demonstrate its chronicity but that may be helpful if the patient has had prior CT or ultrasound examinations to show this has a chronic/stable finding.  Both ovaries appear normal. There is a simple appearing 2.5 cm cyst associated with the left ovary. No follow-up imaging recommended. Note: This recommendation does not apply to premenarchal patients and to those with increased risk (genetic, family history, elevated tumor markers or other high-risk factors) of ovarian cancer. Reference: JACR 2020 Feb; 17(2):248-254.  Numerous nabothian cysts are noted around the cervix.  Other: No free pelvic fluid collections are identified. No inguinal adenopathy.  Musculoskeletal: No significant bony findings.  IMPRESSION: 1. 10 x 7.5 x 7.5 cm exophytic lesion projecting off the posterior fundal region of the uterus. This is most likely a severely degenerated fibroid. Could not totally exclude the possibility of a leiomyosarcoma and a biopsy or excision may be indicated. Comparison with any prior imaging studies may also be helpful as detailed above. 2. Normal ovaries. 3. Numerous nabothian cysts around the cervix.    Assessment/Plan:  50 y.o. [id number]  1. Pelvic mass in female Large Uterine mass 10 x 7.5 x 7.5 cm probably a degenerated fibroid per MRI, but a leiomyosarcoma could not be totally excluded.  LDH was normal. Ca125 and CEA also normal.  Ovaries both normal.  No FF in the pelvis.  Decision to proceed with excision of the mass without any morcellation, will therefore proceed with an XI Robotic TLH/Bilateral Salpingectomy using a large bag to excise the specimen at the  supraumbilical incision.  The overall uterine size including the mass is at 15 cm x 9 cm x 10 cm.  2. Uterine leiomyoma, unspecified location Decision to proceed with XI Robotic TLH/Bilateral Salpingectomy (Bag/no morcellation), possible laparotomy.  Will schedule surgery.  F/U Preop visit.  Princess Bruins MD, 10:20 AM 04/24/2021

## 2021-04-25 ENCOUNTER — Telehealth: Payer: Self-pay

## 2021-04-25 NOTE — Telephone Encounter (Signed)
-----   Message from Burnice Logan, RN sent at 04/25/2021 11:37 AM EDT -----  ----- Message ----- From: Princess Bruins, MD Sent: 04/24/2021  10:37 AM EDT To: Ramond Craver, RMA, # Subject: Schedule surgery                               Large degenerated fibroid, Sarcoma not completely ruled out/Pelvic pain  XI Robotic Total Laparoscopic Hysterectomy with Bilateral Salpingectomy (Bag/No morcellation)  3 hours  [place] for assistance  F/U Preop with me

## 2021-04-26 ENCOUNTER — Encounter: Payer: Self-pay | Admitting: Obstetrics & Gynecology

## 2021-04-26 NOTE — Telephone Encounter (Signed)
Call placed to patient using [employer] interpretor [employer], [place] # [id number].  Reviewed surgery dates and covid testing and  Quarantine requirements. Patient agreeable to proceed with surgery on 06/25/21. Will return call to patient once surgery date and time confirmed. Patient agreeable.   Surgery request sent.   Routing to [employer]

## 2021-04-29 NOTE — Telephone Encounter (Signed)
Spoke with patient regarding surgery benefits, via [address] [place] (ID# [id number]). Patient acknowledges understanding of information presented. Patient is aware that benefits presented are professional benefits only. Patient is aware that once surgery is scheduled, the hospital will call with separate benefits.  Patient requesting a call back the week of June 6th to review benefits again.  Routing to Glorianne Manchester, Therapist, sports, to complete surgery scheduling.

## 2021-05-01 NOTE — Telephone Encounter (Signed)
Call placed to patient using [facility] Interpretor ID # [phone].   Spoke with patient. Surgery date request confirmed.  Advised surgery is scheduled for [facility], 06/25/21 at 1100. Surgery instruction sheet and hospital brochure reviewed, printed copy will be mailed to verified address on file.  Covid 19 quarantine and testing requirements reviewed.   Routing to provider. Encounter closed.   Cc: Hayley Carder

## 2021-06-06 ENCOUNTER — Encounter: Payer: Self-pay | Admitting: Obstetrics & Gynecology

## 2021-06-06 ENCOUNTER — Other Ambulatory Visit: Payer: Self-pay

## 2021-06-06 ENCOUNTER — Ambulatory Visit (INDEPENDENT_AMBULATORY_CARE_PROVIDER_SITE_OTHER): Payer: Self-pay | Admitting: Obstetrics & Gynecology

## 2021-06-06 VITALS — BP 118/80

## 2021-06-06 DIAGNOSIS — D259 Leiomyoma of uterus, unspecified: Secondary | ICD-10-CM

## 2021-06-06 NOTE — Progress Notes (Signed)
Madison Thompson 03-Oct-1971 [id number]        50 y.o.  G3P3003   RP: Preop XI Robotic TLH/Bilateral Salpingectomy for symptomatic large Fibroid  HPI: Abdominopelvic pressure associated with a large pelvic mass per Pelvic US 02/11/2021.  Had initially presented because had difficulty controlling her urine.  Better on medication.  Menses with normal flow every 1-2 months.  Sexually active, occasionally having discomfort with IC.   OB History  Gravida Para Term Preterm AB Living  3 3 3     3   SAB IAB Ectopic Multiple Live Births          3    # Outcome Date GA Lbr Len/2nd Weight Sex Delivery Anes PTL Lv  3 Term           2 Term           1 Term             Past medical history,surgical history, problem list, medications, allergies, family history and social history were all reviewed and documented in the EPIC chart.   Directed ROS with pertinent positives and negatives documented in the history of present illness/assessment and plan.  Exam:  Vitals:   06/06/21 1336  BP: 118/80   General appearance:  Normal  Pelvic US on February 11, 2021: 11.3 x 9.4 x 8.6 cm complicated solid mass likely arising from the posterior uterus.  Left ovary normal.  Right ovary not visualized.  No other pelvic findings reported.   MRI 04/08/2021: Urinary Tract: The bladder is unremarkable. No bladder mass, asymmetric bladder wall thickening or calculi.   Bowel: The rectum, sigmoid colon and visualized small-bowel loops are unremarkable.   Vascular/Lymphatic: The major vascular structures are unremarkable. No aneurysm dissection. No pelvic or inguinal lymphadenopathy the.   Reproductive: The uterus is enlarged measuring approximately 15 x 9 x 10 cm. This is mainly due to a large exophytic lesion projecting off the posterior fundal region of the uterus. This lesion measures 10 x 7.5 x 7.5 cm. It has very heterogeneous areas of low T2 and high T2 signal intensity. The areas of low T2 signal  intensity demonstrate subsequent contrast enhancement. This lesion is diffusion negative and I think it is most likely a severely degenerated fibroid. Could not totally exclude the possibility of a leiomyosarcoma and a biopsy or excision may be indicated. I do not have any prior imaging studies to demonstrate its chronicity but that may be helpful if the patient has had prior CT or ultrasound examinations to show this has a chronic/stable finding.   Both ovaries appear normal. There is a simple appearing 2.5 cm cyst associated with the left ovary. No follow-up imaging recommended.   Assessment/Plan:  50 y.o. G3P3003   1. Uterine leiomyoma, unspecified location  Large symptomatic probably degenerating uterine fibroma.  Decision to proceed with hysterectomy.  We will attempt a robotic approach but patient informed of the possibility of needing a laparotomy.  XI robotic total laparoscopic hysterectomy with bilateral salpingectomy using a bag to avoid intracorporeal morcellation.  Preop preparation, surgery risks and benefits as well as postop precautions and management reviewed with patient.  Patient voiced understanding and agreement with plan.                        Patient was counseled as to the risk of surgery to include the following:  1. Infection (prohylactic antibiotics will be administered)  2. DVT/Pulmonary Embolism (prophylactic  pneumo compression stockings will be used)  3.Trauma to internal organs requiring additional surgical procedure to repair any injury to internal organs requiring perhaps additional hospitalization days.  4.Hemmorhage requiring transfusion and blood products which carry risks such as anaphylactic reaction, hepatitis and AIDS  Patient had received literature information on the procedure scheduled and all her questions were answered and fully accepts all risk.    Princess Bruins MD, 1:40 PM 06/06/2021

## 2021-06-07 ENCOUNTER — Encounter: Payer: Self-pay | Admitting: Obstetrics & Gynecology

## 2021-06-17 NOTE — Telephone Encounter (Signed)
Sinclair Grooms sent to Enrigue Catena, [place] Patient informed she has until Wednesday June 22 to pay balance.

## 2021-06-18 ENCOUNTER — Other Ambulatory Visit: Payer: Self-pay

## 2021-06-18 ENCOUNTER — Encounter (HOSPITAL_BASED_OUTPATIENT_CLINIC_OR_DEPARTMENT_OTHER): Payer: Self-pay | Admitting: Obstetrics & Gynecology

## 2021-06-18 DIAGNOSIS — Z972 Presence of dental prosthetic device (complete) (partial): Secondary | ICD-10-CM

## 2021-06-18 DIAGNOSIS — D259 Leiomyoma of uterus, unspecified: Secondary | ICD-10-CM

## 2021-06-18 DIAGNOSIS — R519 Headache, unspecified: Secondary | ICD-10-CM

## 2021-06-18 DIAGNOSIS — K219 Gastro-esophageal reflux disease without esophagitis: Secondary | ICD-10-CM

## 2021-06-18 HISTORY — DX: Presence of dental prosthetic device (complete) (partial): Z97.2

## 2021-06-18 HISTORY — DX: Gastro-esophageal reflux disease without esophagitis: K21.9

## 2021-06-18 HISTORY — DX: Leiomyoma of uterus, unspecified: D25.9

## 2021-06-18 HISTORY — DX: Headache, unspecified: R51.9

## 2021-06-18 NOTE — Progress Notes (Signed)
YOU ARE SCHEDULED FOR A COVID TEST ON    06-21-2021 at 230 pm. THIS TEST MUST BE DONE BEFORE SURGERY. GO TO  [place], [place], IT IS APPROXIMATELY 2 MINUTES PAST [employer] ON THE RIGHT AND REMAIN IN YOUR CAR, THIS IS A DRIVE UP TEST.       Your procedure is scheduled on 06-25-2021  Report to [facility]   Call this number if you have problems the morning of surgery  :[phone].   OUR ADDRESS IS [address].  WE ARE LOCATED IN [facility].  PLEASE BRING YOUR INSURANCE CARD AND PHOTO ID DAY OF SURGERY.  ONLY ONE PERSON ALLOWED IN FACILITY WAITING AREA.                                     REMEMBER:  DO NOT EAT FOOD, CANDY GUM OR MINTS  OR LIQUIDS  AFTER MIDNIGHT .    YOU MAY  BRUSH YOUR TEETH MORNING OF SURGERY AND RINSE YOUR MOUTH OUT, NO CHEWING GUM CANDY OR MINTS.    _____________________________________________________________________     TAKE THESE MEDICATIONS MORNING OF SURGERY WITH A SIP OF WATER:  NONE  ONE VISITOR IS ALLOWED IN WAITING ROOM ONLY DAY OF SURGERY.  NO VISITOR MAY SPEND THE NIGHT.  VISITOR ARE ALLOWED TO STAY UNTIL 800 PM.                                    DO NOT WEAR JEWERLY, MAKE UP. DO NOT WEAR LOTIONS, POWDERS, PERFUMES OR NAIL POLISH. DO NOT SHAVE FOR 24 HOURS PRIOR TO DAY OF SURGERY. MEN MAY SHAVE FACE AND NECK. CONTACTS, GLASSES, OR DENTURES MAY NOT BE WORN TO SURGERY.                                    [facility] IS NOT RESPONSIBLE  FOR ANY BELONGINGS.                                                                    Marland Kitchen           [facility] - Preparing for Surgery Before surgery, you can play an important role.  Because skin is not sterile, your skin needs to be as free of germs as possible.  You can reduce the number of germs on your skin by washing with CHG (chlorahexidine gluconate) soap before surgery.  CHG is an antiseptic cleaner which kills germs and bonds  with the skin to continue killing germs even after washing. Please DO NOT use if you have an allergy to CHG or antibacterial soaps.  If your skin becomes reddened/irritated stop using the CHG and inform your nurse when you arrive at Short Stay. Do not shave (including legs and underarms) for at least 48 hours prior to the first CHG shower.  You may shave your face/neck. Please follow these instructions carefully:  1.  Shower with CHG Soap  the night before surgery and the  morning of Surgery.  2.  If you choose to wash your hair, wash your hair first as usual with your  normal  shampoo.  3.  After you shampoo, rinse your hair and body thoroughly to remove the  shampoo.                            4.  Use CHG as you would any other liquid soap.  You can apply chg directly  to the skin and wash                      Gently with a scrungie or clean washcloth.  5.  Apply the CHG Soap to your body ONLY FROM THE NECK DOWN.   Do not use on face/ open                           Wound or open sores. Avoid contact with eyes, ears mouth and genitals (private parts).                       Wash face,  Genitals (private parts) with your normal soap.             6.  Wash thoroughly, paying special attention to the area where your surgery  will be performed.  7.  Thoroughly rinse your body with warm water from the neck down.  8.  DO NOT shower/wash with your normal soap after using and rinsing off  the CHG Soap.                9.  Pat yourself dry with a clean towel.            10.  Wear clean pajamas.            11.  Place clean sheets on your bed the night of your first shower and do not  sleep with pets. Day of Surgery : Do not apply any lotions/deodorants the morning of surgery.  Please wear clean clothes to the hospital/surgery center.  FAILURE TO FOLLOW THESE INSTRUCTIONS MAY RESULT IN THE CANCELLATION OF YOUR SURGERY PATIENT SIGNATURE_________________________________  NURSE  SIGNATURE__________________________________  ________________________________________________________________________                                                        QUESTIONS Madison Thompson PRE OP NURSE PHONE [phone].

## 2021-06-18 NOTE — Progress Notes (Signed)
Spoke w/ via phone for pre-op interview---pt with pacific spanish interepeter [place]aro number [phone] Lab needs dos----  urine preg             Lab results------has lab appt 06-21-2021 900 am for cbc t & s COVID test -----patient states asymptomatic no test needed Arrive at -------900 am 06-25-2021 NPO after MN NO  Med rec completed Medications to take morning of surgery -----none Diabetic medication -----n/a Patient instructed no nail polish to be worn day of surgery Patient instructed to bring photo id and insurance card day of surgery Patient aware to have Driver (ride ) / caregiver   spouse sergio will stay  for 24 hours after surgery  Patient Special Instructions -----none Pre-Op special Istructions -----none Patient verbalized understanding of instructions that were given at this phone interview. Patient denies shortness of breath, chest pain, fever, cough at this phone interview.   Spoke with Rae Halsted dtr in law by phone  cell [phone] per pt request and reviewed pre op instructions for 06-25-2021 surgery.  Spanish interpreter requested for day of surgery, email on pt chart

## 2021-06-21 ENCOUNTER — Encounter (HOSPITAL_COMMUNITY)
Admission: RE | Admit: 2021-06-21 | Discharge: 2021-06-21 | Disposition: A | Discharge status: Home / Self Care | Payer: No Typology Code available for payment source | Source: Ambulatory Visit | Attending: Obstetrics & Gynecology | Admitting: Obstetrics & Gynecology

## 2021-06-21 ENCOUNTER — Other Ambulatory Visit: Payer: Self-pay

## 2021-06-21 ENCOUNTER — Other Ambulatory Visit (HOSPITAL_COMMUNITY)
Admission: RE | Admit: 2021-06-21 | Discharge: 2021-06-21 | Disposition: A | Discharge status: Home / Self Care | Payer: No Typology Code available for payment source | Source: Ambulatory Visit | Attending: Obstetrics & Gynecology | Admitting: Obstetrics & Gynecology

## 2021-06-21 ENCOUNTER — Telehealth: Payer: Self-pay | Admitting: *Deleted

## 2021-06-21 DIAGNOSIS — Z01812 Encounter for preprocedural laboratory examination: Secondary | ICD-10-CM | POA: Insufficient documentation

## 2021-06-21 DIAGNOSIS — Z20822 Contact with and (suspected) exposure to covid-19: Secondary | ICD-10-CM | POA: Insufficient documentation

## 2021-06-21 LAB — CBC
HCT: 26.2 % — ABNORMAL LOW (ref 36.0–46.0)
Hemoglobin: 7.3 g/dL — ABNORMAL LOW (ref 12.0–15.0)
MCH: 19.5 pg — ABNORMAL LOW (ref 26.0–34.0)
MCHC: 27.9 g/dL — ABNORMAL LOW (ref 30.0–36.0)
MCV: 69.9 fL — ABNORMAL LOW (ref 80.0–100.0)
Platelets: 297 10*3/uL (ref 150–400)
RBC: 3.75 MIL/uL — ABNORMAL LOW (ref 3.87–5.11)
RDW: 16.1 % — ABNORMAL HIGH (ref 11.5–15.5)
WBC: 8.3 10*3/uL (ref 4.0–10.5)
nRBC: 0 % (ref 0.0–0.2)

## 2021-06-21 LAB — SARS CORONAVIRUS 2 (TAT 6-24 HRS): SARS Coronavirus 2: NEGATIVE

## 2021-06-21 MED ORDER — MEGESTROL ACETATE 40 MG PO TABS: 40.0000 mg | ORAL_TABLET | Freq: Two times a day (BID) | ORAL | 1 refills | Status: DC

## 2021-06-21 NOTE — Progress Notes (Addendum)
Secure chat to jill hamm rn to let dr Dellis Filbert know pt hemaglobin 7.3 at lab appt today  Addendum: secure chat with jill hamm rn hemaglobin of 7.3 sent to dr Dellis Filbert

## 2021-06-21 NOTE — Telephone Encounter (Signed)
Madison Bruins, MD  You 1 hour ago (12:49 PM)     We need to bring up her Hb before proceeding with surgery.  Please start on Megace 40 mg BID until surgery.  FeSO4 325 mg TID.  F/U with me in 4 weeks with a repeat Hb.  Postpone the surgery by 6 weeks.    Call placed to patient x2, no answer, no voicemail.  No alternative number.  MyChart not active.   Megace Rx sent to pharmacy on file.   Call placed to central scheduling, spoke with [facility]. Surgery cancelled.    Routing to [employer] to attempt to contact patient again this afternoon.

## 2021-06-21 NOTE — Telephone Encounter (Signed)
Patient is scheduled for XI Robotic TLH BS on 06/25/21.   [facility] pre-op has notified the office. "hemaglobin 7.3 at pre op lab today please let dr Dellis Filbert know"  Routing to Dr. Dellis Filbert

## 2021-06-24 NOTE — Telephone Encounter (Signed)
Spoke with patient.  Earnest Bailey, CMA interpreting.  Advised per Dr. Dellis Filbert.  Rx has been sent. Patient read back instructions.   OV scheduled for 07/24/21 at 1015.   Advised patient I will return call to discuss available surgery dates.   Patient verbalizes understanding and is agreeable.

## 2021-06-24 NOTE — Telephone Encounter (Signed)
Called pt  Unable to leave message

## 2021-06-24 NOTE — Telephone Encounter (Signed)
Call returned to patient using [place] ID# [phone].   Reviewed surgery dates. Surgery rescheduled to 08/20/21. Patient will see Dr. Dellis Filbert for pre-op and 4 week f/u on 07/24/21. Advised patient I will f/u with additional instructions after her f/u with Dr. Dellis Filbert. Patient verbalizes understanding and is agreeable.   Routing to provider for final review. Patient is agreeable to disposition. Will close encounter.  Cc: [employer]

## 2021-06-25 ENCOUNTER — Ambulatory Visit (HOSPITAL_BASED_OUTPATIENT_CLINIC_OR_DEPARTMENT_OTHER)
Admission: RE | Admit: 2021-06-25 | Payer: No Typology Code available for payment source | Source: Home / Self Care | Admitting: Obstetrics & Gynecology

## 2021-06-25 LAB — TYPE AND SCREEN
ABO/RH(D): A POS
Antibody Screen: NEGATIVE

## 2021-06-25 SURGERY — XI ROBOTIC ASSISTED LAPAROSCOPIC HYSTERECTOMY AND SALPINGECTOMY
Anesthesia: Choice

## 2021-07-11 ENCOUNTER — Ambulatory Visit: Payer: Self-pay | Admitting: Obstetrics & Gynecology

## 2021-07-24 ENCOUNTER — Other Ambulatory Visit: Payer: Self-pay

## 2021-07-24 ENCOUNTER — Telehealth: Payer: Self-pay | Admitting: *Deleted

## 2021-07-24 ENCOUNTER — Encounter: Payer: Self-pay | Admitting: Obstetrics & Gynecology

## 2021-07-24 ENCOUNTER — Ambulatory Visit (INDEPENDENT_AMBULATORY_CARE_PROVIDER_SITE_OTHER): Payer: Self-pay | Admitting: Obstetrics & Gynecology

## 2021-07-24 VITALS — BP 116/80 | HR 63 | Resp 16 | Ht 61.25 in | Wt 174.0 lb

## 2021-07-24 DIAGNOSIS — D259 Leiomyoma of uterus, unspecified: Secondary | ICD-10-CM

## 2021-07-24 NOTE — Progress Notes (Signed)
Madison Thompson August 14, 1971 [id number]        50 y.o.  G3P3003   RP: Preop XI Robotic TLH/Bilateral Salpingectomy for symptomatic large Fibroid   HPI: Abdominopelvic pressure associated with a large pelvic mass per Pelvic US 02/11/2021.  Had initially presented because had difficulty controlling her urine.  Better on medication.  Menses with normal flow every 1-2 months.  Sexually active, occasionally having discomfort with IC.    OB History  Gravida Para Term Preterm AB Living  '3 3 3     3  '$ SAB IAB Ectopic Multiple Live Births          3    # Outcome Date GA Lbr Len/2nd Weight Sex Delivery Anes PTL Lv  3 Term           2 Term           1 Term             Past medical history,surgical history, problem list, medications, allergies, family history and social history were all reviewed and documented in the EPIC chart.   Directed ROS with pertinent positives and negatives documented in the history of present illness/assessment and plan.  Exam:  Vitals:   07/24/21 1025  BP: 116/80  Pulse: 63  Resp: 16  Weight: 174 lb (78.9 kg)  Height: 5' 1.25" (1.556 m)   General appearance:  Normal  Pelvic US on February 11, 2021: 11.3 x 9.4 x 8.6 cm complicated solid mass likely arising from the posterior uterus.  Left ovary normal.  Right ovary not visualized.  No other pelvic findings reported.   MRI 04/08/2021: Urinary Tract: The bladder is unremarkable. No bladder mass, asymmetric bladder wall thickening or calculi.   Bowel: The rectum, sigmoid colon and visualized small-bowel loops are unremarkable.   Vascular/Lymphatic: The major vascular structures are unremarkable. No aneurysm dissection. No pelvic or inguinal lymphadenopathy the.   Reproductive: The uterus is enlarged measuring approximately 15 x 9 x 10 cm. This is mainly due to a large exophytic lesion projecting off the posterior fundal region of the uterus. This lesion measures 10 x 7.5 x 7.5 cm. It has very  heterogeneous areas of low T2 and high T2 signal intensity. The areas of low T2 signal intensity demonstrate subsequent contrast enhancement. This lesion is diffusion negative and I think it is most likely a severely degenerated fibroid. Could not totally exclude the possibility of a leiomyosarcoma and a biopsy or excision may be indicated. I do not have any prior imaging studies to demonstrate its chronicity but that may be helpful if the patient has had prior CT or ultrasound examinations to show this has a chronic/stable finding.   Both ovaries appear normal. There is a simple appearing 2.5 cm cyst associated with the left ovary. No follow-up imaging recommended.     Assessment/Plan:  50 y.o. G3P3003    1. Uterine leiomyoma, unspecified location  Large symptomatic probably degenerating uterine fibroma.  Decision to proceed with hysterectomy.  We will attempt a robotic approach but patient informed of the possibility of needing a laparotomy.  XI robotic total laparoscopic hysterectomy with bilateral salpingectomy using a bag to avoid intracorporeal morcellation.  Preop preparation, surgery risks and benefits as well as postop precautions and management reviewed with patient.  Patient voiced understanding and agreement with plan.                         Patient was  counseled as to the risk of surgery to include the following:   1. Infection (prohylactic antibiotics will be administered)   2. DVT/Pulmonary Embolism (prophylactic pneumo compression stockings will be used)   3.Trauma to internal organs requiring additional surgical procedure to repair any injury to internal organs requiring perhaps additional hospitalization days.   4.Hemmorhage requiring transfusion and blood products which carry risks such as anaphylactic reaction, hepatitis and AIDS   Patient had received literature information on the procedure scheduled and all her questions were answered and fully accepts all risk.    Princess Bruins MD, 10:47 AM 07/24/2021

## 2021-07-24 NOTE — Telephone Encounter (Signed)
Surgery time for 08/20/21 has changed to 0730, arrive at 0530.   Madison Thompson -please contact patient to notify of surgery time change.

## 2021-07-29 ENCOUNTER — Encounter: Payer: Self-pay | Admitting: Obstetrics & Gynecology

## 2021-08-02 NOTE — Telephone Encounter (Signed)
Spoke with patient using [facility] ID # [id number].  Surgery date request confirmed.  Advised surgery is scheduled for 08/20/21 at [facility] [place].  Surgery instruction sheet and hospital brochure reviewed, printed copy in [place] will be mailed. Patient advised if Covid screening and quarantine requirements and agreeable.   Routing to provider. Encounter closed.  Cc: Hayley Carder

## 2021-08-13 ENCOUNTER — Encounter (HOSPITAL_BASED_OUTPATIENT_CLINIC_OR_DEPARTMENT_OTHER): Payer: Self-pay | Admitting: Obstetrics & Gynecology

## 2021-08-13 ENCOUNTER — Other Ambulatory Visit: Payer: Self-pay

## 2021-08-13 NOTE — Progress Notes (Signed)
Your procedure is scheduled on 08-20-2021  Report to [facility]   Call this number if you have problems the morning of surgery  [phone].   OUR ADDRESS IS [place].  WE ARE LOCATED IN [facility].  PLEASE BRING YOUR INSURANCE CARD AND PHOTO ID DAY OF SURGERY.  ONLY ONE PERSON ALLOWED IN FACILITY WAITING AREA.                                     REMEMBER:  DO NOT EAT FOOD, CANDY GUM OR MINTS    OR LIQUIDS AFTER MIDNIGHT  THE NIGHT BEFORE YOUR SURGERY.   YOU MAY  BRUSH YOUR TEETH MORNING OF SURGERY AND RINSE YOUR MOUTH OUT, NO CHEWING GUM CANDY OR MINTS.    _____________________________________________________________________     TAKE THESE MEDICATIONS MORNING OF SURGERY WITH A SIP OF WATER:  NONE  ONE VISITOR IS ALLOWED IN WAITING ROOM ONLY DAY OF SURGERY.  NO VISITOR MAY SPEND THE NIGHT.  VISITOR ARE ALLOWED TO STAY UNTIL 800 PM.                                    DO NOT WEAR JEWERLY, MAKE UP. DO NOT WEAR LOTIONS, POWDERS, PERFUMES OR NAIL POLISH. DO NOT SHAVE FOR 48 HOURS PRIOR TO DAY OF SURGERY. MEN MAY SHAVE FACE AND NECK. CONTACTS, GLASSES, OR DENTURES MAY NOT BE WORN TO SURGERY.                                    [facility] IS NOT RESPONSIBLE  FOR ANY BELONGINGS.                                                                    Marland Kitchen           [facility] - Preparing for Surgery Before surgery, you can play an important role.  Because skin is not sterile, your skin needs to be as free of germs as possible.  You can reduce the number of germs on your skin by washing with CHG (chlorahexidine gluconate) soap before surgery.  CHG is an antiseptic cleaner which kills germs and bonds with the skin to continue killing germs even after washing. Please DO NOT use if you have an allergy to CHG or antibacterial soaps.  If your skin becomes reddened/irritated stop using the CHG and inform your nurse when you arrive  at Short Stay. Do not shave (including legs and underarms) for at least 48 hours prior to the first CHG shower.  You may shave your face/neck. Please follow these instructions carefully:  1.  Shower with CHG Soap the night before surgery and the  morning of Surgery.  2.  If you choose to wash your hair, wash your hair first as usual with your  normal  shampoo.  3.  After you shampoo, rinse your hair and body thoroughly to remove the  shampoo.  4.  Use CHG as you would any other liquid soap.  You can apply chg directly  to the skin and wash                      Gently with a scrungie or clean washcloth.  5.  Apply the CHG Soap to your body ONLY FROM THE NECK DOWN.   Do not use on face/ open                           Wound or open sores. Avoid contact with eyes, ears mouth and genitals (private parts).                       Wash face,  Genitals (private parts) with your normal soap.             6.  Wash thoroughly, paying special attention to the area where your surgery  will be performed.  7.  Thoroughly rinse your body with warm water from the neck down.  8.  DO NOT shower/wash with your normal soap after using and rinsing off  the CHG Soap.                9.  Pat yourself dry with a clean towel.            10.  Wear clean pajamas.            11.  Place clean sheets on your bed the night of your first shower and do not  sleep with pets. Day of Surgery : Do not apply any lotions/deodorants the morning of surgery.  Please wear clean clothes to the hospital/surgery center.  FAILURE TO FOLLOW THESE INSTRUCTIONS MAY RESULT IN THE CANCELLATION OF YOUR SURGERY PATIENT SIGNATURE_________________________________  NURSE SIGNATURE__________________________________  ________________________________________________________________________                                                        QUESTIONS Madison Thompson PRE OP NURSE PHONE [phone].

## 2021-08-13 NOTE — Progress Notes (Addendum)
Spoke w/ via phone for pre-op interview---pt with [place] [facility] number [phone] Lab needs dos----  urine preg             Lab results------has lab appt 08-16-2021 900 am for cbc t & s COVID test -----covid positive 06-21-2021 in epic/no retest needed Arrive at ------- 530 am 08-20-2021 NPO after MN NO  Med rec completed Medications to take morning of surgery -----none Diabetic medication -----n/a Patient instructed no nail polish to be worn day of surgery Patient instructed to bring photo id and insurance card day of surgery Patient aware to have Driver (ride ) / caregiver   spouse sergio will stay  for 24 hours after surgery  Patient Special Instructions -----none Pre-Op special Istructions -----none Patient verbalized understanding of instructions that were given at this phone interview. Patient denies shortness of breath, chest pain, fever, cough at this phone interview.   Spoke with Rae Halsted dtr in law by phone  cell [phone] per pt request and reviewed pre op instructions for 8-23--2022 surgery.  Spanish interpreter  female if availablerequested for day of surgery, email on pt chart

## 2021-08-16 ENCOUNTER — Encounter (HOSPITAL_COMMUNITY)
Admission: RE | Admit: 2021-08-16 | Discharge: 2021-08-16 | Disposition: A | Discharge status: Home / Self Care | Payer: No Typology Code available for payment source | Source: Ambulatory Visit | Attending: Obstetrics & Gynecology | Admitting: Obstetrics & Gynecology

## 2021-08-16 ENCOUNTER — Other Ambulatory Visit: Payer: Self-pay

## 2021-08-16 DIAGNOSIS — Z01812 Encounter for preprocedural laboratory examination: Secondary | ICD-10-CM | POA: Insufficient documentation

## 2021-08-16 LAB — CBC
HCT: 41.5 % (ref 36.0–46.0)
Hemoglobin: 13.2 g/dL (ref 12.0–15.0)
MCH: 26.2 pg (ref 26.0–34.0)
MCHC: 31.8 g/dL (ref 30.0–36.0)
MCV: 82.5 fL (ref 80.0–100.0)
Platelets: 308 10*3/uL (ref 150–400)
RBC: 5.03 MIL/uL (ref 3.87–5.11)
WBC: 10 10*3/uL (ref 4.0–10.5)
nRBC: 0 % (ref 0.0–0.2)

## 2021-08-19 ENCOUNTER — Encounter (HOSPITAL_BASED_OUTPATIENT_CLINIC_OR_DEPARTMENT_OTHER): Payer: Self-pay | Admitting: Obstetrics & Gynecology

## 2021-08-19 NOTE — Progress Notes (Signed)
Spoke with patient by phone and patient stated her daughter in law had interpreted instructions for [facility] tomorrow and she has no questions, called with [place] interpreter number [phone].

## 2021-08-20 ENCOUNTER — Ambulatory Visit (HOSPITAL_BASED_OUTPATIENT_CLINIC_OR_DEPARTMENT_OTHER): Payer: No Typology Code available for payment source | Admitting: Certified Registered Nurse Anesthetist

## 2021-08-20 ENCOUNTER — Encounter (HOSPITAL_BASED_OUTPATIENT_CLINIC_OR_DEPARTMENT_OTHER)
Admission: RE | Disposition: A | Discharge status: Home / Self Care | Payer: Self-pay | Source: Home / Self Care | Attending: Obstetrics & Gynecology

## 2021-08-20 ENCOUNTER — Encounter (HOSPITAL_BASED_OUTPATIENT_CLINIC_OR_DEPARTMENT_OTHER): Payer: Self-pay | Admitting: Obstetrics & Gynecology

## 2021-08-20 ENCOUNTER — Other Ambulatory Visit: Payer: Self-pay

## 2021-08-20 ENCOUNTER — Ambulatory Visit (HOSPITAL_BASED_OUTPATIENT_CLINIC_OR_DEPARTMENT_OTHER)
Admission: RE | Admit: 2021-08-20 | Discharge: 2021-08-20 | Disposition: A | Discharge status: Home / Self Care | Payer: No Typology Code available for payment source | Attending: Obstetrics & Gynecology | Admitting: Obstetrics & Gynecology

## 2021-08-20 DIAGNOSIS — Z9889 Other specified postprocedural states: Secondary | ICD-10-CM

## 2021-08-20 DIAGNOSIS — R102 Pelvic and perineal pain: Secondary | ICD-10-CM

## 2021-08-20 DIAGNOSIS — N840 Polyp of corpus uteri: Secondary | ICD-10-CM

## 2021-08-20 DIAGNOSIS — D219 Benign neoplasm of connective and other soft tissue, unspecified: Secondary | ICD-10-CM

## 2021-08-20 DIAGNOSIS — Z8616 Personal history of COVID-19: Secondary | ICD-10-CM | POA: Insufficient documentation

## 2021-08-20 DIAGNOSIS — Z87891 Personal history of nicotine dependence: Secondary | ICD-10-CM | POA: Insufficient documentation

## 2021-08-20 DIAGNOSIS — D259 Leiomyoma of uterus, unspecified: Secondary | ICD-10-CM | POA: Insufficient documentation

## 2021-08-20 HISTORY — PX: ROBOTIC ASSISTED LAPAROSCOPIC HYSTERECTOMY AND SALPINGECTOMY: SHX6379

## 2021-08-20 LAB — TYPE AND SCREEN
ABO/RH(D): A POS
Antibody Screen: NEGATIVE

## 2021-08-20 LAB — CBC
HCT: 39.7 % (ref 36.0–46.0)
Hemoglobin: 12.5 g/dL (ref 12.0–15.0)
MCH: 26.7 pg (ref 26.0–34.0)
MCHC: 31.5 g/dL (ref 30.0–36.0)
MCV: 84.8 fL (ref 80.0–100.0)
Platelets: 248 10*3/uL (ref 150–400)
RBC: 4.68 MIL/uL (ref 3.87–5.11)
RDW: 23.5 % — ABNORMAL HIGH (ref 11.5–15.5)
WBC: 17.7 10*3/uL — ABNORMAL HIGH (ref 4.0–10.5)
nRBC: 0 % (ref 0.0–0.2)

## 2021-08-20 LAB — POCT PREGNANCY, URINE: Preg Test, Ur: NEGATIVE

## 2021-08-20 SURGERY — XI ROBOTIC ASSISTED LAPAROSCOPIC HYSTERECTOMY AND SALPINGECTOMY
Anesthesia: General | Site: Abdomen

## 2021-08-20 MED ORDER — FENTANYL CITRATE (PF) 100 MCG/2ML IJ SOLN
INTRAMUSCULAR | Status: AC
  Filled 2021-08-20: qty 2

## 2021-08-20 MED ORDER — OXYCODONE-ACETAMINOPHEN 5-325 MG PO TABS
2.0000 | ORAL_TABLET | ORAL | Status: DC | PRN
  Administered 2021-08-20: 2 via ORAL

## 2021-08-20 MED ORDER — OXYCODONE-ACETAMINOPHEN 5-325 MG PO TABS
ORAL_TABLET | ORAL | Status: AC
  Filled 2021-08-20: qty 2

## 2021-08-20 MED ORDER — LACTATED RINGERS IV SOLN: INTRAVENOUS | Status: DC

## 2021-08-20 MED ORDER — PROPOFOL 500 MG/50ML IV EMUL
INTRAVENOUS | Status: DC | PRN
  Administered 2021-08-20: 50 ug/kg/min via INTRAVENOUS

## 2021-08-20 MED ORDER — DEXMEDETOMIDINE (PRECEDEX) IN NS 20 MCG/5ML (4 MCG/ML) IV SYRINGE
PREFILLED_SYRINGE | INTRAVENOUS | Status: AC
  Filled 2021-08-20: qty 5

## 2021-08-20 MED ORDER — KETOROLAC TROMETHAMINE 30 MG/ML IJ SOLN: 30.0000 mg | Freq: Once | INTRAMUSCULAR | Status: DC | PRN

## 2021-08-20 MED ORDER — FENTANYL CITRATE (PF) 250 MCG/5ML IJ SOLN
INTRAMUSCULAR | Status: AC
  Filled 2021-08-20: qty 5

## 2021-08-20 MED ORDER — HYDROMORPHONE HCL 1 MG/ML IJ SOLN: 0.2500 mg | INTRAMUSCULAR | Status: DC | PRN

## 2021-08-20 MED ORDER — ACETAMINOPHEN 10 MG/ML IV SOLN
INTRAVENOUS | Status: DC | PRN
  Administered 2021-08-20: 1000 mg via INTRAVENOUS

## 2021-08-20 MED ORDER — ONDANSETRON HCL 4 MG/2ML IJ SOLN
INTRAMUSCULAR | Status: AC
  Filled 2021-08-20: qty 2

## 2021-08-20 MED ORDER — ROCURONIUM BROMIDE 10 MG/ML (PF) SYRINGE
PREFILLED_SYRINGE | INTRAVENOUS | Status: AC
  Filled 2021-08-20: qty 10

## 2021-08-20 MED ORDER — SCOPOLAMINE 1 MG/3DAYS TD PT72
MEDICATED_PATCH | TRANSDERMAL | Status: DC | PRN
  Administered 2021-08-20: 1 via TRANSDERMAL

## 2021-08-20 MED ORDER — CEFAZOLIN SODIUM-DEXTROSE 2-4 GM/100ML-% IV SOLN
INTRAVENOUS | Status: AC
  Filled 2021-08-20: qty 100

## 2021-08-20 MED ORDER — ONDANSETRON HCL 4 MG/2ML IJ SOLN
INTRAMUSCULAR | Status: DC | PRN
  Administered 2021-08-20: 4 mg via INTRAVENOUS

## 2021-08-20 MED ORDER — PROPOFOL 10 MG/ML IV BOLUS
INTRAVENOUS | Status: DC | PRN
  Administered 2021-08-20: 170 mg via INTRAVENOUS

## 2021-08-20 MED ORDER — KETOROLAC TROMETHAMINE 30 MG/ML IJ SOLN
INTRAMUSCULAR | Status: AC
  Filled 2021-08-20: qty 1

## 2021-08-20 MED ORDER — ROCURONIUM BROMIDE 100 MG/10ML IV SOLN
INTRAVENOUS | Status: DC | PRN
  Administered 2021-08-20: 60 mg via INTRAVENOUS
  Administered 2021-08-20 (×2): 10 mg via INTRAVENOUS

## 2021-08-20 MED ORDER — SODIUM CHLORIDE 0.9 % IR SOLN
Status: DC | PRN
  Administered 2021-08-20: 1000 mL

## 2021-08-20 MED ORDER — ACETAMINOPHEN 10 MG/ML IV SOLN
INTRAVENOUS | Status: AC
  Filled 2021-08-20: qty 100

## 2021-08-20 MED ORDER — MEPERIDINE HCL 25 MG/ML IJ SOLN: 6.2500 mg | INTRAMUSCULAR | Status: DC | PRN

## 2021-08-20 MED ORDER — DEXMEDETOMIDINE (PRECEDEX) IN NS 20 MCG/5ML (4 MCG/ML) IV SYRINGE
PREFILLED_SYRINGE | INTRAVENOUS | Status: DC | PRN
  Administered 2021-08-20: 4 ug via INTRAVENOUS
  Administered 2021-08-20: 8 ug via INTRAVENOUS

## 2021-08-20 MED ORDER — POVIDONE-IODINE 10 % EX SWAB
2.0000 "application " | Freq: Once | CUTANEOUS | Status: AC
  Administered 2021-08-20: 2 via TOPICAL

## 2021-08-20 MED ORDER — CEFAZOLIN SODIUM-DEXTROSE 2-4 GM/100ML-% IV SOLN
2.0000 g | INTRAVENOUS | Status: AC
  Administered 2021-08-20: 2 g via INTRAVENOUS

## 2021-08-20 MED ORDER — OXYCODONE-ACETAMINOPHEN 7.5-325 MG PO TABS: 1.0000 | ORAL_TABLET | Freq: Four times a day (QID) | ORAL | 0 refills | Status: DC | PRN

## 2021-08-20 MED ORDER — PROPOFOL 500 MG/50ML IV EMUL
INTRAVENOUS | Status: AC
  Filled 2021-08-20: qty 50

## 2021-08-20 MED ORDER — PROMETHAZINE HCL 25 MG/ML IJ SOLN: 6.2500 mg | INTRAMUSCULAR | Status: DC | PRN

## 2021-08-20 MED ORDER — PROPOFOL 10 MG/ML IV BOLUS
INTRAVENOUS | Status: AC
  Filled 2021-08-20: qty 20

## 2021-08-20 MED ORDER — BUPIVACAINE HCL (PF) 0.25 % IJ SOLN
INTRAMUSCULAR | Status: DC | PRN
  Administered 2021-08-20: 20 mL

## 2021-08-20 MED ORDER — DEXAMETHASONE SODIUM PHOSPHATE 4 MG/ML IJ SOLN
INTRAMUSCULAR | Status: DC | PRN
  Administered 2021-08-20: 10 mg via INTRAVENOUS

## 2021-08-20 MED ORDER — HYDROCODONE-ACETAMINOPHEN 5-325 MG PO TABS
ORAL_TABLET | ORAL | Status: AC
  Filled 2021-08-20: qty 2

## 2021-08-20 MED ORDER — LIDOCAINE HCL (PF) 2 % IJ SOLN
INTRAMUSCULAR | Status: AC
  Filled 2021-08-20: qty 5

## 2021-08-20 MED ORDER — LIDOCAINE HCL (CARDIAC) PF 100 MG/5ML IV SOSY
PREFILLED_SYRINGE | INTRAVENOUS | Status: DC | PRN
  Administered 2021-08-20: 80 mg via INTRAVENOUS

## 2021-08-20 MED ORDER — FENTANYL CITRATE (PF) 100 MCG/2ML IJ SOLN
INTRAMUSCULAR | Status: DC | PRN
  Administered 2021-08-20 (×2): 50 ug via INTRAVENOUS
  Administered 2021-08-20: 25 ug via INTRAVENOUS
  Administered 2021-08-20 (×3): 50 ug via INTRAVENOUS
  Administered 2021-08-20: 25 ug via INTRAVENOUS

## 2021-08-20 MED ORDER — MIDAZOLAM HCL 2 MG/2ML IJ SOLN
INTRAMUSCULAR | Status: AC
  Filled 2021-08-20: qty 2

## 2021-08-20 MED ORDER — SCOPOLAMINE 1 MG/3DAYS TD PT72
MEDICATED_PATCH | TRANSDERMAL | Status: AC
  Filled 2021-08-20: qty 1

## 2021-08-20 MED ORDER — DEXAMETHASONE SODIUM PHOSPHATE 10 MG/ML IJ SOLN
INTRAMUSCULAR | Status: AC
  Filled 2021-08-20: qty 1

## 2021-08-20 MED ORDER — KETOROLAC TROMETHAMINE 30 MG/ML IJ SOLN
INTRAMUSCULAR | Status: DC | PRN
  Administered 2021-08-20: 30 mg via INTRAVENOUS

## 2021-08-20 MED ORDER — MIDAZOLAM HCL 5 MG/5ML IJ SOLN
INTRAMUSCULAR | Status: DC | PRN
  Administered 2021-08-20: 2 mg via INTRAVENOUS

## 2021-08-20 MED ORDER — KETOROLAC TROMETHAMINE 30 MG/ML IJ SOLN
30.0000 mg | Freq: Once | INTRAMUSCULAR | Status: AC
  Administered 2021-08-20: 30 mg via INTRAVENOUS

## 2021-08-20 MED ORDER — HYDROCODONE-ACETAMINOPHEN 5-325 MG PO TABS
1.0000 | ORAL_TABLET | ORAL | Status: DC | PRN
  Administered 2021-08-20: 2 via ORAL

## 2021-08-20 MED ORDER — SUGAMMADEX SODIUM 200 MG/2ML IV SOLN
INTRAVENOUS | Status: DC | PRN
  Administered 2021-08-20: 200 mg via INTRAVENOUS

## 2021-08-20 MED ORDER — ACETAMINOPHEN 325 MG PO TABS: 650.0000 mg | ORAL_TABLET | ORAL | Status: DC | PRN

## 2021-08-20 SURGICAL SUPPLY — 65 items
BARRIER ADHS 3X4 INTERCEED (GAUZE/BANDAGES/DRESSINGS) IMPLANT
CATH FOLEY 3WAY  5CC 16FR (CATHETERS) ×2
CATH FOLEY 3WAY 5CC 16FR (CATHETERS) ×1 IMPLANT
COVER BACK TABLE 60X90IN (DRAPES) ×2 IMPLANT
COVER TIP SHEARS 8 DVNC (MISCELLANEOUS) ×1 IMPLANT
COVER TIP SHEARS 8MM DA VINCI (MISCELLANEOUS) ×2
DECANTER SPIKE VIAL GLASS SM (MISCELLANEOUS) ×4 IMPLANT
DEFOGGER SCOPE WARMER CLEARIFY (MISCELLANEOUS) ×2 IMPLANT
DERMABOND ADVANCED (GAUZE/BANDAGES/DRESSINGS) ×1
DERMABOND ADVANCED .7 DNX12 (GAUZE/BANDAGES/DRESSINGS) ×1 IMPLANT
DRAPE ARM DVNC X/XI (DISPOSABLE) ×4 IMPLANT
DRAPE COLUMN DVNC XI (DISPOSABLE) ×1 IMPLANT
DRAPE DA VINCI XI ARM (DISPOSABLE) ×8
DRAPE DA VINCI XI COLUMN (DISPOSABLE) ×2
DRAPE UTILITY XL STRL (DRAPES) ×2 IMPLANT
DURAPREP 26ML APPLICATOR (WOUND CARE) ×2 IMPLANT
ELECT REM PT RETURN 9FT ADLT (ELECTROSURGICAL) ×2
ELECTRODE REM PT RTRN 9FT ADLT (ELECTROSURGICAL) ×1 IMPLANT
GAUZE 4X4 16PLY ~~LOC~~+RFID DBL (SPONGE) ×4 IMPLANT
GAUZE PETROLATUM 1 X8 (GAUZE/BANDAGES/DRESSINGS) IMPLANT
GAUZE VASELINE 3X9 (GAUZE/BANDAGES/DRESSINGS) ×2 IMPLANT
GLOVE SRG 8 PF TXTR STRL LF DI (GLOVE) IMPLANT
GLOVE SURG ENC MOIS LTX SZ6.5 (GLOVE) ×6 IMPLANT
GLOVE SURG ENC MOIS LTX SZ8 (GLOVE) IMPLANT
GLOVE SURG UNDER POLY LF SZ7 (GLOVE) ×10 IMPLANT
GLOVE SURG UNDER POLY LF SZ8 (GLOVE)
GOWN STRL REUS W/TWL XL LVL3 (GOWN DISPOSABLE) IMPLANT
HOLDER FOLEY CATH W/STRAP (MISCELLANEOUS) IMPLANT
IRRIG SUCT STRYKERFLOW 2 WTIP (MISCELLANEOUS) ×2
IRRIGATION SUCT STRKRFLW 2 WTP (MISCELLANEOUS) ×1 IMPLANT
KIT TURNOVER CYSTO (KITS) ×2 IMPLANT
LEGGING LITHOTOMY PAIR STRL (DRAPES) ×2 IMPLANT
OBTURATOR OPTICAL STANDARD 8MM (TROCAR) ×2
OBTURATOR OPTICAL STND 8 DVNC (TROCAR) ×1
OBTURATOR OPTICALSTD 8 DVNC (TROCAR) ×1 IMPLANT
OCCLUDER COLPOPNEUMO (BALLOONS) ×2 IMPLANT
PACK ROBOT WH (CUSTOM PROCEDURE TRAY) ×2 IMPLANT
PACK ROBOTIC GOWN (GOWN DISPOSABLE) ×2 IMPLANT
PACK TRENDGUARD 450 HYBRID PRO (MISCELLANEOUS) ×1 IMPLANT
PAD OB MATERNITY 4.3X12.25 (PERSONAL CARE ITEMS) ×2 IMPLANT
PAD PREP 24X48 CUFFED NSTRL (MISCELLANEOUS) ×2 IMPLANT
POUCH ENDO CATCH II 15MM (MISCELLANEOUS) IMPLANT
PROTECTOR NERVE ULNAR (MISCELLANEOUS) ×4 IMPLANT
RTRCTR WOUND ALEXIS 18CM SML (INSTRUMENTS)
SAVER CELL AAL HAEMONETICS (INSTRUMENTS) IMPLANT
SEAL CANN UNIV 5-8 DVNC XI (MISCELLANEOUS) ×4 IMPLANT
SEAL XI 5MM-8MM UNIVERSAL (MISCELLANEOUS) ×8
SET IRRIG Y TYPE TUR BLADDER L (SET/KITS/TRAYS/PACK) IMPLANT
SET TRI-LUMEN FLTR TB AIRSEAL (TUBING) ×2 IMPLANT
SPONGE T-LAP 4X18 ~~LOC~~+RFID (SPONGE) ×2 IMPLANT
SUT ETHIBOND 0 (SUTURE) IMPLANT
SUT VIC AB 4-0 PS2 27 (SUTURE) ×6 IMPLANT
SUT VICRYL 0 UR6 27IN ABS (SUTURE) ×2 IMPLANT
SUT VLOC 180 0 9IN  GS21 (SUTURE) ×2
SUT VLOC 180 0 9IN GS21 (SUTURE) ×1 IMPLANT
SUT VLOC 180 2-0 6IN GS21 (SUTURE) IMPLANT
TIP RUMI ORANGE 6.7MMX12CM (TIP) IMPLANT
TIP UTERINE 5.1X6CM LAV DISP (MISCELLANEOUS) IMPLANT
TIP UTERINE 6.7X10CM GRN DISP (MISCELLANEOUS) IMPLANT
TIP UTERINE 6.7X6CM WHT DISP (MISCELLANEOUS) IMPLANT
TIP UTERINE 6.7X8CM BLUE DISP (MISCELLANEOUS) ×2 IMPLANT
TOWEL OR 17X26 10 PK STRL BLUE (TOWEL DISPOSABLE) ×2 IMPLANT
TRENDGUARD 450 HYBRID PRO PACK (MISCELLANEOUS) ×2
TROCAR PORT AIRSEAL 5X120 (TROCAR) ×2 IMPLANT
WATER STERILE IRR 1000ML POUR (IV SOLUTION) ×2 IMPLANT

## 2021-08-20 NOTE — H&P (Signed)
Madison Thompson is an 50 y.o. female.  [id number]   RP: XI Robotic TLH/Bilateral Salpingectomy for symptomatic large Fibroid   HPI: Abdominopelvic pressure associated with a large pelvic mass per Pelvic US 02/11/2021.  Had initially presented because had difficulty controlling her urine.  Better on medication.  Menses with normal flow every 1-2 months.  Sexually active, occasionally having discomfort with IC.  Pertinent Gynecological History: Menses:  Oligomenorrhea every 1-2 months Contraception: tubal ligation Blood transfusions: none Sexually transmitted diseases: no past history Last mammogram: normal  Last pap: normal  OB History: G3, P3L3   Menstrual History:  No LMP recorded.    Past Medical History:  Diagnosis Date   Anemia    COVID 2021   migraine, loss of appetite  vomiting x 3 weeks all symptoms resolved   COVID 06/21/2021   asymptomatic   GERD (gastroesophageal reflux disease) 06/18/2021   Migraine 06/18/2021   Uterine leiomyoma 06/18/2021   Wears partial dentures 06/18/2021   lower    Past Surgical History:  Procedure Laterality Date   TUBAL LIGATION     yrs ago per pt on 06-18-2021    Family History  Problem Relation Age of Onset   Diabetes Sister    Heart disease Brother     Social History:  reports that she has quit smoking. Her smoking use included cigarettes. She has never used smokeless tobacco. She reports that she does not drink alcohol and does not use drugs.  Allergies: No Known Allergies  Medications Prior to Admission  Medication Sig Dispense Refill Last Dose   acetaminophen (TYLENOL) 500 MG tablet Take 1,000 mg by mouth every 6 (six) hours as needed.   Past Week   Ferrous Sulfate (IRON PO) Take 65 mg by mouth.   Past Week   megestrol (MEGACE) 40 MG tablet Take 1 tablet (40 mg total) by mouth 2 (two) times daily. 60 tablet 1 Past Week    REVIEW OF SYSTEMS: A ROS was performed and pertinent positives and negatives are included in the  history.  GENERAL: No fevers or chills. HEENT: No change in vision, no earache, sore throat or sinus congestion. NECK: No pain or stiffness. CARDIOVASCULAR: No chest pain or pressure. No palpitations. PULMONARY: No shortness of breath, cough or wheeze. GASTROINTESTINAL: No abdominal pain, nausea, vomiting or diarrhea, melena or bright red blood per rectum. GENITOURINARY: No urinary frequency, urgency, hesitancy or dysuria. MUSCULOSKELETAL: No joint or muscle pain, no back pain, no recent trauma. DERMATOLOGIC: No rash, no itching, no lesions. ENDOCRINE: No polyuria, polydipsia, no heat or cold intolerance. No recent change in weight. HEMATOLOGICAL: No anemia or easy bruising or bleeding. NEUROLOGIC: No headache, seizures, numbness, tingling or weakness. PSYCHIATRIC: No depression, no loss of interest in normal activity or change in sleep pattern.     Blood pressure 130/78, pulse 60, temperature 99.3 F (37.4 C), temperature source Oral, resp. rate 20, height '5\' 1"'$  (1.549 m), weight 79.9 kg, SpO2 100 %.  Physical Exam:  RCR Lungs clear  Results for orders placed or performed during the hospital encounter of 08/20/21 (from the past 24 hour(s))  Pregnancy, urine POC     Status: None   Collection Time: 08/20/21  5:59 AM  Result Value Ref Range   Preg Test, Ur NEGATIVE NEGATIVE   Pelvic US on February 11, 2021: 11.3 x 9.4 x 8.6 cm complicated solid mass likely arising from the posterior uterus.  Left ovary normal.  Right ovary not visualized.  No other pelvic  findings reported.   MRI 04/08/2021: Urinary Tract: The bladder is unremarkable. No bladder mass, asymmetric bladder wall thickening or calculi.   Bowel: The rectum, sigmoid colon and visualized small-bowel loops are unremarkable.   Vascular/Lymphatic: The major vascular structures are unremarkable. No aneurysm dissection. No pelvic or inguinal lymphadenopathy the.   Reproductive: The uterus is enlarged measuring approximately 15 x 9 x  10 cm. This is mainly due to a large exophytic lesion projecting off the posterior fundal region of the uterus. This lesion measures 10 x 7.5 x 7.5 cm. It has very heterogeneous areas of low T2 and high T2 signal intensity. The areas of low T2 signal intensity demonstrate subsequent contrast enhancement. This lesion is diffusion negative and I think it is most likely a severely degenerated fibroid. Could not totally exclude the possibility of a leiomyosarcoma and a biopsy or excision may be indicated. I do not have any prior imaging studies to demonstrate its chronicity but that may be helpful if the patient has had prior CT or ultrasound examinations to show this has a chronic/stable finding.   Both ovaries appear normal. There is a simple appearing 2.5 cm cyst associated with the left ovary. No follow-up imaging recommended.     Assessment/Plan:  50 y.o. G3P3003    1. Uterine leiomyoma, unspecified location  Large symptomatic probably degenerating uterine fibroma.  Decision to proceed with hysterectomy.  We will attempt a robotic approach but patient informed of the possibility of needing a laparotomy.  XI robotic total laparoscopic hysterectomy with bilateral salpingectomy using a bag to avoid intracorporeal morcellation.  Preop preparation, surgery risks and benefits as well as postop precautions and management reviewed with patient.  Patient voiced understanding and agreement with plan.                         Patient was counseled as to the risk of surgery to include the following:   1. Infection (prohylactic antibiotics will be administered)   2. DVT/Pulmonary Embolism (prophylactic pneumo compression stockings will be used)   3.Trauma to internal organs requiring additional surgical procedure to repair any injury to internal organs requiring perhaps additional hospitalization days.   4.Hemmorhage requiring transfusion and blood products which carry risks such as anaphylactic  reaction, hepatitis and AIDS   Patient had received literature information on the procedure scheduled and all her questions were answered and fully accepts all risk.  Marie-Lyne Corby Vandenberghe 08/20/2021, 7:10 AM

## 2021-08-20 NOTE — Progress Notes (Signed)
Phone call to Dr. Dellis Filbert. Informed MD pt has tolerated PO intake, ambulated in hall, voided, and pain is controlled with oral analgesics. Abdominal incisions C/D/I, scant to no drainage on peripad, VSS. Orders given to discharge pt. Informed pt and husband at bedside, verbal understanding given.

## 2021-08-20 NOTE — Transfer of Care (Signed)
Immediate Anesthesia Transfer of Care Note  Patient: Madison Thompson  Procedure(s) Performed: XI ROBOTIC ASSISTED LAPAROSCOPIC HYSTERECTOMY AND Bilateral SALPINGECTOMY (Bag/No morcellation) (Abdomen)  Patient Location: PACU  Anesthesia Type:General  Level of Consciousness: awake, alert  and oriented  Airway & Oxygen Therapy: Patient Spontanous Breathing and Patient connected to nasal cannula oxygen  Post-op Assessment: Report given to RN and Post -op Vital signs reviewed and stable  Post vital signs: Reviewed and stable  Last Vitals:  Vitals Value Taken Time  BP 121/61 08/20/21 1011  Temp 36.8 C 08/20/21 1015  Pulse 65 08/20/21 1015  Resp 22 08/20/21 1015  SpO2 100 % 08/20/21 1015  Vitals shown include unvalidated device data.  Last Pain:  Vitals:   08/20/21 0601  TempSrc: Oral  PainSc: 0-No pain      Patients Stated Pain Goal: 5 ([phone])  Complications: No notable events documented.

## 2021-08-20 NOTE — Anesthesia Procedure Notes (Signed)
Procedure Name: Intubation Date/Time: 08/20/2021 7:40 AM Performed by: Bufford Spikes, CRNA Pre-anesthesia Checklist: Patient identified, Emergency Drugs available, Suction available and Patient being monitored Patient Re-evaluated:Patient Re-evaluated prior to induction Oxygen Delivery Method: Circle system utilized Preoxygenation: Pre-oxygenation with 100% oxygen Induction Type: IV induction Ventilation: Mask ventilation without difficulty Laryngoscope Size: Miller and 2 Grade View: Grade II Tube type: Oral Tube size: 7.0 mm Number of attempts: 1 Airway Equipment and Method: Stylet and Oral airway Placement Confirmation: ETT inserted through vocal cords under direct vision, positive ETCO2 and breath sounds checked- equal and bilateral Secured at: 21 cm Tube secured with: Tape Dental Injury: Teeth and Oropharynx as per pre-operative assessment

## 2021-08-20 NOTE — Anesthesia Preprocedure Evaluation (Signed)
Anesthesia Evaluation  Patient identified by MRN, date of birth, ID band Patient awake    Reviewed: Allergy & Precautions, NPO status , Patient's Chart, lab work & pertinent test results  Airway Mallampati: I       Dental no notable dental hx.    Pulmonary former smoker,    Pulmonary exam normal        Cardiovascular negative cardio ROS Normal cardiovascular exam     Neuro/Psych negative psych ROS   GI/Hepatic Neg liver ROS, GERD  Medicated and Controlled,  Endo/Other  negative endocrine ROS  Renal/GU negative Renal ROS  negative genitourinary   Musculoskeletal negative musculoskeletal ROS (+)   Abdominal Normal abdominal exam  (+)   Peds  Hematology   Anesthesia Other Findings   Reproductive/Obstetrics negative OB ROS                             Anesthesia Physical Anesthesia Plan  ASA: 2  Anesthesia Plan: General   Post-op Pain Management:    Induction:   PONV Risk Score and Plan: 4 or greater and Ondansetron, Dexamethasone and Midazolam  Airway Management Planned: Oral ETT  Additional Equipment: None  Intra-op Plan:   Post-operative Plan: Extubation in OR  Informed Consent: I have reviewed the patients History and Physical, chart, labs and discussed the procedure including the risks, benefits and alternatives for the proposed anesthesia with the patient or authorized representative who has indicated his/her understanding and acceptance.     Dental advisory given  Plan Discussed with: CRNA  Anesthesia Plan Comments:         Anesthesia Quick Evaluation

## 2021-08-20 NOTE — Anesthesia Postprocedure Evaluation (Signed)
Anesthesia Post Note  Patient: Bush  Procedure(s) Performed: XI ROBOTIC ASSISTED LAPAROSCOPIC HYSTERECTOMY AND Bilateral SALPINGECTOMY (Bag/No morcellation) (Abdomen)     Patient location during evaluation: PACU Anesthesia Type: General Level of consciousness: awake and sedated Pain management: pain level controlled Vital Signs Assessment: post-procedure vital signs reviewed and stable Respiratory status: spontaneous breathing Cardiovascular status: stable Postop Assessment: no apparent nausea or vomiting Anesthetic complications: no   No notable events documented.  Last Vitals:  Vitals:   08/20/21 1045 08/20/21 1102  BP: 135/77 140/72  Pulse: 71 72  Resp: (!) 22 20  Temp:    SpO2: 100% 100%    Last Pain:  Vitals:   08/20/21 0601  TempSrc: Oral  PainSc: 0-No pain                 Huston Foley

## 2021-08-20 NOTE — Op Note (Addendum)
Operative Note  08/20/2021  10:14 AM  PATIENT:  Madison Thompson  50 y.o. female  PRE-OPERATIVE DIAGNOSIS:  Uterine leiomyoma, pelvic pain  POST-OPERATIVE DIAGNOSIS:  Uterine leiomyoma, pelvic pain  PROCEDURE:  Procedure(s): XI ROBOTIC ASSISTED TOTAL LAPAROSCOPIC HYSTERECTOMY AND BILATERAL SALPINGECTOMY  SURGEON:  Surgeon(s): Princess Bruins, MD  ANESTHESIA:   general  FINDINGS: Large posterior intramural and subserosal fibroid.  Tubes post tubal ligation.  Normal ovaries.  DESCRIPTION OF OPERATION: Under general anesthesia with endotracheal intubation, the patient is in lithotomy position.  She is prepped with DuraPrep on the abdomen and with Betadine on the suprapubic, vulvar and vaginal areas.  She is draped as usual.  Timeout is done.  The Foley is inserted in the bladder.  The vaginal exam reveals an anteverted uterus, enlarged by a fibroid.  No adnexal mass felt.  The weighted speculum is inserted in the vagina and the anterior lip of the cervix is grasped with a tenaculum.  Hysterometry is at 9 cm.  A lumbar 8 Rumi with the medium Koh ring are put in place at the uterus and cervix.  The other instruments are removed.  Infiltration of Marcaine one quarter plain at the supraumbilical area.  Incision over 1.5 cm at that level with the skull pill.  The aponeurosis is grasped with 2 Coker's and incised with Mayo scissors under direct vision.  The parietal peritoneum is opened bluntly with the finger.  A pursestring stitch is put in place of the aponeurosis with Vicryl 0.  The Sheryle Hail is inserted at that level under direct vision.  A pneumoperitoneum is created with CO2.  Inspection with the camera revealing a free abdominal wall for port entrance.  The uterus presents a large intramural and subserosal posterior fibroid about 10 to 12 cm in diameter.  Infiltration of Marcaine one quarter plain at all incision sites, small incisions with the scalpel.  3 robotic ports are inserted under  direct vision in line with the umbilicus, 2 on the right and 1 at the distal left.  A 5 mm and assistant port is inserted under direct vision at the left medial aspect just above the umbilical line.  The patient is positioned in 30 degree Trendelenburg.  The robot is docked.  Targeting is done.  All robotic instruments are inserted under direct vision with the long bipolar in the first arm, the camera in the second arm, the scissors in the third arm and the pro grasp in the fourth arm.  We go to the console.      Pictures are taken of the uterus and adnexae.  Both ureters are in normal anatomic position with good peristalsis.  We started on the right side with cauterization and section of the right mesosalpinx.  The distal part of the tube is removed from the abdomen.  We cauterized and sectioned the right utero-ovarian ligament.  We cauterized and sectioned the right round ligament.  We then followed the broad ligament on the right and stopped just before the right uterine artery.  The visceral peritoneum is opened anteriorly and we start lowering the bladder.  We proceeded exactly the same way on the left side.  We further descend the bladder past the [facility] ring.  We then cauterized the uterine artery backflow on each side of the uterus.  We then cauterized the right uterine artery and sectioned it.  We cauterized the left uterine artery and sectioned it.  The vaginal occluder was inflated.  We opened the vagina over  the [facility] ring with the scissor tip anteriorly, on each side and then posteriorly.  The uterus was completely detached with the cervix, the bilateral proximal tubes and the large posterior fibroid.  We then used the robotic tenaculum replacing the long bipolar in the first arm to excise the posterior fibroid.  This way, we were able to pass the uterus with both proximal tubes and cervix vaginally.  We then were able to pass the fibroid vaginally as it was soft and by necrosis.  Both specimens were sent  together to pathology.  We then put back the vaginal occluder.  The instruments were changed to the cutting needle driver in the third arm and the long tip in the first arm.  The pro grasp was in the fourth arm.  We used a V-Loc 0 at 9 inches to close the vaginal vault.  We irrigated and suctioned the pelvic cavity.  Hemostasis was adequate at all sites.  We started at the right angle of the vagina and did a running suture of the leg 0 all the way to the left angle and then back to the middle.  Hemostasis was adequate.The needle was removed from the abdominal pelvic cavity.  We irrigated and suctioned again.  Both ureters were visualized with good peristalsis.  Urine was clear.  We took pictures of the pelvic cavity and the appendix.  All robotic instruments were removed.  The robot was undocked.  We went to laparoscopy time.  The remaining fluid was suctioned.  Laparoscopic instruments were removed.  The CO2 was evacuated.  All ports were removed under direct vision.  The supraumbilical incision was closed by attaching the pursestring stitch.  All skin incisions were closed with a subcuticular suture of Vicryl 4-0.  Dermabond was added on all incisions.  The vaginal occluder was removed.  The Foley was removed.  The patient was brought to recovery room in good and stable status.  ESTIMATED BLOOD LOSS: 25 mL   Intake/Output Summary (Last 24 hours) at 08/20/2021 1014 Last data filed at 08/20/2021 0944 Gross per 24 hour  Intake 750 ml  Output 225 ml  Net 525 ml     BLOOD ADMINISTERED:none   LOCAL MEDICATIONS USED:  MARCAINE     SPECIMEN:  Source of Specimen:  Uterus with cervix, tubes and large fibroid  DISPOSITION OF SPECIMEN:  PATHOLOGY  COUNTS:  YES  PLAN OF CARE: Transfer to PACU  Madison LavoieMD10:14 AM

## 2021-08-21 ENCOUNTER — Encounter (HOSPITAL_BASED_OUTPATIENT_CLINIC_OR_DEPARTMENT_OTHER): Payer: Self-pay | Admitting: Obstetrics & Gynecology

## 2021-08-21 LAB — SURGICAL PATHOLOGY

## 2021-08-27 ENCOUNTER — Encounter: Payer: Self-pay | Admitting: Obstetrics & Gynecology

## 2021-09-06 ENCOUNTER — Other Ambulatory Visit: Payer: Self-pay

## 2021-09-06 ENCOUNTER — Ambulatory Visit (INDEPENDENT_AMBULATORY_CARE_PROVIDER_SITE_OTHER): Payer: Self-pay | Admitting: Obstetrics & Gynecology

## 2021-09-06 ENCOUNTER — Encounter: Payer: Self-pay | Admitting: Obstetrics & Gynecology

## 2021-09-06 VITALS — BP 114/70 | HR 68 | Resp 16

## 2021-09-06 DIAGNOSIS — Z09 Encounter for follow-up examination after completed treatment for conditions other than malignant neoplasm: Secondary | ICD-10-CM

## 2021-09-06 NOTE — Progress Notes (Signed)
    Madison Thompson July 21, 1971 [id number]        50 y.o.  G3P3003   RP: Postop XI robotic TLH/bilateral salpingectomy on August 20, 2021  HPI: Skin incisions nontender with no redness or discharge.  Doing very well with no abdominal pelvic pain.  No vaginal bleeding.  No vaginal discharge.  No fever.   OB History  Gravida Para Term Preterm AB Living  '3 3 3     3  '$ SAB IAB Ectopic Multiple Live Births          3    # Outcome Date GA Lbr Len/2nd Weight Sex Delivery Anes PTL Lv  3 Term           2 Term           1 Term             Past medical history,surgical history, problem list, medications, allergies, family history and social history were all reviewed and documented in the EPIC chart.   Directed ROS with pertinent positives and negatives documented in the history of present illness/assessment and plan.  Exam:  Vitals:   09/06/21 1204  BP: 114/70  Pulse: 68  Resp: 16   General appearance:  Normal  Abdomen: Incisions well closed, no erythema, no discharge.  Gynecologic exam: Vulva normal.  Speculum:  Vaginal vault well closed.  No discharge, no blood.  Patho 08/20/2021: FINAL MICROSCOPIC DIAGNOSIS:   A. UTERUS, CERVIX, BILATERAL FALLOPIAN TUBES AND FIBROID:  - Uterus with leiomyoma, 13.4 cm  - Benign endometrial polyps  - Benign inactive endometrium  - Benign unremarkable cervix  - Benign unremarkable bilateral fallopian tubes  - No evidence of malignancy    Assessment/Plan:  50 y.o. G3P3003   1. Status post gynecological surgery, follow-up exam  Excellent postop healing.  No complication.  Precautions reviewed.  Follow-up in 4 to 6 weeks.  Princess Bruins MD, 12:15 PM 09/06/2021

## 2021-10-03 ENCOUNTER — Encounter: Payer: Self-pay | Admitting: Obstetrics & Gynecology

## 2021-10-08 ENCOUNTER — Other Ambulatory Visit: Payer: Self-pay

## 2021-10-08 ENCOUNTER — Encounter: Payer: Self-pay | Admitting: Obstetrics & Gynecology

## 2021-10-08 ENCOUNTER — Ambulatory Visit (INDEPENDENT_AMBULATORY_CARE_PROVIDER_SITE_OTHER): Payer: Self-pay | Admitting: Obstetrics & Gynecology

## 2021-10-08 VITALS — BP 120/80

## 2021-10-08 DIAGNOSIS — Z09 Encounter for follow-up examination after completed treatment for conditions other than malignant neoplasm: Secondary | ICD-10-CM

## 2021-10-08 NOTE — Progress Notes (Signed)
    [place] June 06, 1971 [id number]        50 y.o.  G3P3003   RP: Postop XI robotic TLH/bilateral salpingectomy on August 20, 2021   HPI: Skin incisions with no redness or discharge.  Doing very well with no abdominal pelvic pain.  No vaginal bleeding.  No vaginal discharge.  No fever.    OB History  Gravida Para Term Preterm AB Living  3 3 3     3   SAB IAB Ectopic Multiple Live Births          3    # Outcome Date GA Lbr Len/2nd Weight Sex Delivery Anes PTL Lv  3 Term           2 Term           1 Term             Past medical history,surgical history, problem list, medications, allergies, family history and social history were all reviewed and documented in the EPIC chart.   Directed ROS with pertinent positives and negatives documented in the history of present illness/assessment and plan.  Exam:  Vitals:   10/08/21 1132  BP: 120/80   General appearance:  Normal   Abdomen: Incisions well closed, no erythema, no discharge.   Gynecologic exam: Vulva normal.  Bimanual exam:  Vaginal vault well closed, no induration, NT.  No pelvic mass, NT.   Patho 08/20/2021: FINAL MICROSCOPIC DIAGNOSIS:   A. UTERUS, CERVIX, BILATERAL FALLOPIAN TUBES AND FIBROID:  - Uterus with leiomyoma, 13.4 cm  - Benign endometrial polyps  - Benign inactive endometrium  - Benign unremarkable cervix  - Benign unremarkable bilateral fallopian tubes  - No evidence of malignancy      Assessment/Plan:  50 y.o. G3P3003    1. Status post gynecological surgery, follow-up exam  Excellent postop healing.  No complication.  Precautions reviewed.  Can resume all physical and sexual activities. Can start back working.    Princess Bruins MD, 11:40 AM 10/08/2021

## 2022-01-27 ENCOUNTER — Ambulatory Visit (INDEPENDENT_AMBULATORY_CARE_PROVIDER_SITE_OTHER): Payer: Self-pay | Admitting: Obstetrics and Gynecology

## 2022-01-27 ENCOUNTER — Other Ambulatory Visit: Payer: Self-pay

## 2022-01-27 VITALS — BP 110/70 | HR 79 | Ht 61.0 in | Wt 184.0 lb

## 2022-01-27 DIAGNOSIS — R3915 Urgency of urination: Secondary | ICD-10-CM

## 2022-01-27 DIAGNOSIS — R350 Frequency of micturition: Secondary | ICD-10-CM

## 2022-01-27 DIAGNOSIS — N8110 Cystocele, unspecified: Secondary | ICD-10-CM

## 2022-01-27 LAB — URINALYSIS, COMPLETE W/RFL CULTURE
Bacteria, UA: NONE SEEN /HPF
Bilirubin Urine: NEGATIVE
Casts: NONE SEEN /LPF
Crystals: NONE SEEN /HPF
Glucose, UA: NEGATIVE
Hgb urine dipstick: NEGATIVE
Hyaline Cast: NONE SEEN /LPF
Ketones, ur: NEGATIVE
Leukocyte Esterase: NEGATIVE
Nitrites, Initial: NEGATIVE
RBC / HPF: NONE SEEN /HPF (ref 0–2)
Specific Gravity, Urine: 1.02 (ref 1.001–1.035)
WBC, UA: NONE SEEN /HPF (ref 0–5)
Yeast: NONE SEEN /HPF
pH: 7 (ref 5.0–8.0)

## 2022-01-27 LAB — NO CULTURE INDICATED

## 2022-01-27 NOTE — Progress Notes (Signed)
GYNECOLOGY  VISIT   HPI: 50 y.o.   Single  Hispanic  female   [phone] with Patient's last menstrual period was 07/09/2021 (exact date).   here for urinary frequency x2 weeks with urgency.  Spanish interpretor present for the visit today.   Empties bladder every 5 minutes at times.  Sometimes emptying 5 times a night.   Sometimes looses control with cough or sneeze.   No dysuria.  No hematuria.   No vaginal discharge.  No change in partner.   Hot flashes present.   1 coffee per day.  1 tea per night.  Rare soda.  Gets a headache when she does not drink her coffee.   GYNECOLOGIC HISTORY: Patient's last menstrual period was 07/09/2021 (exact date). Contraception:  Hyst/bilateral salpingectomy 07/2021 Menopausal hormone therapy:  None Last mammogram:  01-26-18 Neg/Birads1 Last pap smear:   01-12-17 Neg:Neg HR HPV        OB History     Gravida  3   Para  3   Term  3   Preterm      AB      Living  3      SAB      IAB      Ectopic      Multiple      Live Births  3              Patient Active Problem List   Diagnosis Date Noted   Postoperative state 08/20/2021   Otitis, externa, infective 07/20/2015    Past Medical History:  Diagnosis Date   Anemia    COVID 2021   migraine, loss of appetite  vomiting x 3 weeks all symptoms resolved   COVID 06/21/2021   asymptomatic   GERD (gastroesophageal reflux disease) 06/18/2021   Migraine 06/18/2021   Uterine leiomyoma 06/18/2021   Wears partial dentures 06/18/2021   lower    Past Surgical History:  Procedure Laterality Date   ROBOTIC ASSISTED LAPAROSCOPIC HYSTERECTOMY AND SALPINGECTOMY N/A 08/20/2021   Procedure: XI ROBOTIC ASSISTED LAPAROSCOPIC HYSTERECTOMY AND Bilateral SALPINGECTOMY (Bag/No morcellation);  Surgeon: Princess Bruins, MD;  Location: [facility];  Service: Gynecology;  Laterality: N/A;   TUBAL LIGATION     yrs ago per pt on 06-18-2021    No current outpatient  medications on file.   No current facility-administered medications for this visit.     ALLERGIES: Patient has no known allergies.  Family History  Problem Relation Age of Onset   Diabetes Sister    Heart disease Brother     Social History   Socioeconomic History   Marital status: Single    Spouse name: Not on file   Number of children: Not on file   Years of education: Not on file   Highest education level: Not on file  Occupational History   Not on file  Tobacco Use   Smoking status: Former    Types: Cigarettes   Smokeless tobacco: Never  Vaping Use   Vaping Use: Never used  Substance and Sexual Activity   Alcohol use: No   Drug use: No   Sexual activity: Not Currently    Partners: Male    Birth control/protection: Surgical    Comment: 1st intercourse- 15, partners- 2, married- 80 yrs , btl, hysterectomy  Other Topics Concern   Not on file  Social History Narrative   Not on file   Social Determinants of Health   Financial Resource Strain: Not on file  Food  Insecurity: Not on file  Transportation Needs: Not on file  Physical Activity: Not on file  Stress: Not on file  Social Connections: Not on file  Intimate Partner Violence: Not on file    Review of Systems  Genitourinary:  Positive for frequency and urgency.  All other systems reviewed and are negative.  PHYSICAL EXAMINATION:    BP 110/70    Pulse 79    Ht 5\' 1"  (1.549 m)    Wt 184 lb (83.5 kg)    LMP 07/09/2021 (Exact Date) Comment: btl   SpO2 97%    BMI 34.77 kg/m     General appearance: alert, cooperative and appears stated age  Pelvic: External genitalia:  no lesions              Urethra:  normal appearing urethra with no masses, tenderness or lesions              Bartholins and Skenes: normal                 Vagina: normal appearing vagina with normal color and discharge, no lesions.  Almost second degree cystocele.               Cervix: absent.                 Bimanual Exam:   Uterus:absent.               Adnexa: no mass, fullness, tenderness          Chaperone was present for exam:  Estill Bamberg, CMA.  ASSESSMENT  Cystocele.  Urgency and frequency. Possible overactive bladder symptoms.   PLAN  Urinalysis:  sg 1.020, pH 7.9, 10 - 20 squams, NS RBC, NS WBC, NS bacteria.  No UC indicated.  We discussed bladder irritants and reducing caffeine as a first line treatment for her overactive bladder symptoms.  Medical management of urgency and frequency also reviewed with possible future prescription for Ditropan XL I reviewed dry mouth and constipation as common side effects.  If symptoms persist, consider GC/CT testing.  We also discussed her cystocele and possible treatment with physical therapy or surgical care if desired. FU prn.   An After Visit Summary was printed and given to the patient.  69 mi  total time was spent for this patient encounter, including preparation, face-to-face counseling with the patient, coordination of care, and documentation of the encounter.

## 2022-01-27 NOTE — Patient Instructions (Addendum)
Prolapso de los rganos de la pelvis Pelvic Organ Prolapse Un prolapso de los rganos de la pelvis es una afeccin en las mujeres que implica el estiramiento, la dilatacin o la cada de los rganos de la pelvis a una posicin anormal, ms all de la abertura vaginal. Esto sucede cuando los msculos y tejidos que rodean y sostienen las estructuras plvicas se debilitan o estiran. El prolapso de los rganos de la pelvis puede involucrar a los siguientes rganos: Vagina (prolapso vaginal). tero (prolapso uterino). Vejiga (cistocele). Recto (rectocele). Intestinos (enterocele). Cuando estn comprometidos rganos diferentes a la vagina, estos a menudo se protruyen hacia adentro de la vagina o hacia afuera de la vagina, segn la gravedad del prolapso. Cules son las causas? Esta afeccin puede ser causada por lo siguiente: Derald Macleod de parto y [place]. Ciruga de pelvis anterior. Niveles ms bajos de la hormona estrgeno debido a la menopausia. Levantar sistemticamente objetos que pesan ms de 50 libras (23 kg). Obesidad. Dificultad para defecar a largo plazo (estreimiento crnico). Tos crnica o de larga duracin. Acumulacin de lquido en el abdomen debido a ciertas afecciones. Cules son los signos o sntomas? Los sntomas de esta afeccin incluyen: Escape de [place] (prdida del control de la vejiga) al toser, Brewing technologist, Optometrist esfuerzo y ejercicio (incontinencia urinaria de esfuerzo). Esto puede empeorar inmediatamente despus del parto. Puede mejorar gradualmente con el tiempo. Sensacin de presin en la pelvis o la vagina. Esta presin puede aumentar al toser o al defecar. Un bulto que sobresale de la abertura de la vagina. Dificultad para Production assistant, radio. Dolor en la parte inferior de la espalda. [place] o disminucin del inters sexual. Infecciones reiteradas en la vejiga (infecciones de las vas [place]). Dificultad para  insertar un tampn. En algunas personas, esta afeccin no causa sntomas. Cmo se diagnostica? Esta afeccin puede diagnosticarse con un examen de la vagina y un examen rectal. Durante el examen, pueden pedirle que tosa y que haga un esfuerzo mientras est [place], sentada y de pie. El mdico determinar si se requieren otros estudios, como las pruebas de la funcin de la vejiga. Cmo se trata? El tratamiento de esta afeccin puede depender de los sntomas. El tratamiento puede incluir: Cambios en el estilo de vida, como beber mucho lquido y comer alimentos ricos en [place]. Vaciar la vejiga en horarios programados (terapia de entrenamiento de la vejiga). Esto puede ayudar a reducir o Mining engineer incontinencia urinaria. Estrgeno. Si el prolapso es leve, el estrgeno puede ayudar al aumentar la fuerza y el tono de los msculos del suelo plvico. Ejercicios de Kegel. Estos ejercicios pueden ayudar en los casos leves de prolapso al Training and development officer y tensar los msculos del suelo plvico. Un dispositivo flexible que ayuda a sostener las paredes vaginales y Theatre manager los rganos de la pelvis en su lugar (pesario). El mdico insertar este dispositivo en la vagina. Ciruga. Esta es a menudo la nica forma de tratamiento de los casos graves de prolapso. Siga estas instrucciones en su casa: Comida y bebida Evite ingerir bebidas que contengan cafena o alcohol. Aumente su ingesta de alimentos ricos en fibra para disminuir el estreimiento y el esfuerzo [place]. Actividad [place] peso si se lo recomienda el mdico. Evite levantar objetos pesados y Optometrist esfuerzos mientras se ejercita o trabaja. No contenga la respiracin cuando levante pesas y realice ejercicios de intensidad leve a moderada. Limite sus actividades como se lo haya indicado el mdico. Realice los ejercicios de Kegel como se lo  haya indicado el mdico. Para hacer esto: Contraiga con fuerza los msculos del suelo plvico. Debe  sentir que se eleva y comprime el rea rectal y tensin en el rea vaginal. Mantenga el estmago, las nalgas y las piernas [place]. Mantenga los msculos tensos durante 10 segundos como mximo. Luego relaje los msculos. Repita este ejercicio [place] veces que le haya indicado el mdico. Contine haciendo este ejercicio durante al menos 4 a 6 semanas o el tiempo que le haya indicado su mdico. Indicaciones generales Use los medicamentos de venta libre y los recetados solamente como se lo haya indicado el mdico. Use una toalla higinica o paales para adultos si tiene incontinencia urinaria. Si tiene puesto un pesario, cudelo como se lo haya indicado su mdico. Cumpla con todas las visitas de seguimiento. Esto es importante. Comunquese con un mdico si: Tiene sntomas que interfieren con sus actividades diarias o su vida sexual. Necesita medicamentos para [employer]. Observa un sangrado de la vagina no relacionado con su perodo menstrual. Tiene fiebre. Siente dolor o sangra al [employer]. Sangra al defecar. Pierde orina [employer]. Tiene estreimiento crnico. Su pesario se Diplomatic Services operational officer. Tiene secrecin vaginal con mal olor. Tiene un dolor bajo e inusual en el abdomen. Solicite ayuda de inmediato si: No puede orinar. Resumen Un prolapso de los rganos de la pelvis es el estiramiento, la dilatacin o la cada de los rganos de la pelvis a una posicin anormal. Esto sucede cuando los msculos y tejidos que rodean y sostienen las estructuras plvicas se debilitan o estiran. Cuando estn comprometidos rganos diferentes a la vagina, estos a menudo se protruyen hacia adentro o hacia afuera de la vagina, segn la gravedad del prolapso. En la [employer], esta afeccin debe tratarse solo si produce sntomas. El tratamiento puede incluir cambios en el estilo de vida, estrgeno, ejercicios de Kegel, la insercin de un pesario o [place]. Evite levantar objetos  pesados y Armed forces training and education officer se ejercita o trabaja. No contenga la respiracin cuando levante pesas y realice ejercicios de intensidad leve a moderada. Limite sus actividades como se lo haya indicado el mdico. Esta informacin no tiene Marine scientist el consejo del mdico. Asegrese de hacerle al mdico cualquier pregunta que tenga. Document Revised: 07/19/2020 Document Reviewed: 07/19/2020 Elsevier Patient Education  [place]. Oxybutynin Extended-Release Tablets Sander Nephew es [place] medicamento? La OXIBUTININA trata los sntomas de una vejiga hiperactiva, tales como prdida de control de la vejiga o necesidad frecuente de Garment/textile technologist. Acta relajando los msculos en la vejiga. Pertenece a un grupo de medicamentos llamados antiespasmdicos. Este medicamento puede ser utilizado para otros usos; si tiene alguna pregunta consulte con su proveedor de atencin mdica o con su farmacutico. MARCAS COMUNES: Ditropan XL Qu le debo informar a mi profesional de la salud antes de tomar este medicamento? Necesitan saber si usted presenta alguno de los siguientes problemas o situaciones: Neuropata autonmica Demencia Dificultad para Garment/textile technologist Glaucoma Obstruccin intestinal Enfermedad renal Enfermedad heptica Miastenia grave Mal de Parkinson Una reaccin alrgica o inusual a la oxibutinina, a otros medicamentos, alimentos, colorantes o conservantes Si est embarazada o buscando quedar embarazada Si est amamantando a un beb Cmo debo utilizar este medicamento? Tome este medicamento por va oral con un vaso de agua. Trague el medicamento entero, sin triturar, Microbiologist. Siga las instrucciones de la etiqueta del [place]. Puede tomar este medicamento con o sin alimentos. Administre sus dosis a intervalos regulares. No use su medicamento con  una frecuencia mayor a la indicada. Hable con su equipo de atencin sobre el uso de este medicamento en nios. Puede requerir atencin  especial. Aunque este medicamento se puede recetar a nios tan pequeos como de 6 aos de edad con ciertas afecciones, existen precauciones que deben tomarse. Sobredosis: Pngase en contacto inmediatamente con un centro toxicolgico o una sala de urgencia si usted cree que haya tomado demasiado medicamento. ATENCIN: [employer] es solo para usted. No comparta este medicamento con nadie. Qu sucede si me olvido de una dosis? Si olvida una dosis, tmela lo antes posible. Si es casi la hora de la prxima dosis, tome slo esa dosis. No tome dosis adicionales o dobles. Qu puede interactuar con este medicamento? Antihistamnicos para [place], tos y resfriado Atropina Ciertos medicamentos para problemas de vejiga, tales como oxibutinina, tolterodina Ciertos medicamentos para el mal de Parkinson, tales como benzatropina, trihexifenidilo Ciertos medicamentos para Training and development officer, tales como diciclomina, hiosciamina Ciertos medicamentos para el mareo por movimiento, como escopolamina Claritromicina Eritromicina Ipratropio Medicamentos para infecciones micticas, tales como fluconazol, itraconazol, ketoconazol o voriconazol Puede ser que esta lista no menciona todas las posibles interacciones. Informe a su profesional de [employer]	[place] de [employer] productos a base de hierbas, medicamentos de [place] o suplementos nutritivos que est tomando. Si usted fuma, consume bebidas alcohlicas o si utiliza drogas ilegales, indqueselo tambin a su profesional de [employer]	[place]. Algunas sustancias pueden interactuar con su medicamento. A qu debo estar atento al usar Coca[employer]Cola? Es posible que pasen algunas semanas antes de que pueda notar todos los beneficios de [place]. Es posible que necesite limitar su ingesta de t, caf, gaseosas cafeinadas y alcohol. Estas bebidas pueden empeorar sus sntomas. Puede experimentar somnolencia o mareos. No conduzca, no utilice maquinaria ni haga nada que Insurance underwriter en estado de alerta hasta que sepa cmo le afecta este medicamento. No se siente ni se ponga de pie con rapidez, especialmente si es un paciente de edad avanzada. Esto reduce el riesgo de mareos o [employer]. El alcohol podra interferir con el efecto de este medicamento. Evite consumir bebidas alcohlicas. Se le podra secar la boca. Masticar chicle sin azcar, chupar caramelos duros y beber agua en abundancia podra ser de [place]. Si el problema no desaparece o es grave, consulte a su equipo de atencin. Este medicamento puede resecarle los ojos y provocar visin borrosa. Si [place] lentes de contacto, puede sentir ciertas molestias. Las gotas lubricantes pueden ser tiles. Si el problema no desaparece o es grave, consulte a su profesional de atencin de los ojos. Podra observar el recubrimiento de las tabletas en sus heces de vez en cuando. Esto es normal. Evite el calor extremo. Este medicamento puede hacer que sude menos de lo normal. Su temperatura corporal podra aumentar hasta niveles peligrosos, que podran causar un choque por calor. Qu efectos secundarios puedo tener al [employer] este medicamento? Efectos secundarios que debe informar a su equipo de atencin tan pronto como sea posible: Chief of Staff o angioedema: erupcin cutnea, comezn/picazn, urticaria, hinchazn de la cara, los ojos, los labios, la [place], los brazos o las piernas, dificultad para tragar o Contractor repentino en los ojos o cambio en la visin como visin borrosa, ver halos alrededor [employer], prdida de visin Dificultad para orinar Efectos secundarios que generalmente no requieren atencin mdica (debe informarlos a su equipo de atencin si persisten o si son molestos): Confusin Estreimiento Engineer, petroleum seca Dolor de cabeza Puede ser que esta lista no  menciona todos los posibles efectos secundarios. Comunquese a su mdico por asesoramiento mdico [employer]. Usted puede informar los efectos secundarios a la FDA por telfono al 1-800-FDA-1088. Dnde debo guardar mi medicina? Mantenga fuera del alcance de los nios. Guarde a [employer], entre 15 y 56 grados Celsius (86 y 23 grados Fahrenheit). Proteja de la humedad. Deseche todo el medicamento que no haya utilizado despus de la fecha de vencimiento. ATENCIN: Este folleto es un resumen. Puede ser que no cubra toda la posible informacin. Si usted tiene preguntas acerca de esta medicina, consulte con su mdico, su farmacutico o su profesional de Technical sales engineer.  2022 Elsevier/Gold Standard (2021-07-19 00:00:00)  Vejiga hiperactiva en adultos Overactive Bladder, Adult Vejiga hiperactiva es una afeccin en la que la persona tiene una necesidad sbita y frecuente de Garment/textile technologist. La persona tambin podra tener una prdida de orina si no puede llegar al bao con la rapidez suficiente (incontinencia urinaria). A veces, los sntomas pueden interferir en el trabajo o las actividades sociales. Cules son las causas? La vejiga hiperactiva est asociada con seales nerviosas deficientes entre la vejiga y Nurse, mental health. La vejiga puede recibir la seal de vaciarse antes de que est llena. Usted tambin puede tener msculos muy sensibles que hacen que la vejiga se contraiga demasiado pronto. Esta afeccin tambin puede deberse a otros factores, como los siguientes: Afecciones mdicas: Infeccin de las vas urinarias. Infeccin de los tejidos cercanos. Agrandamiento de la prstata. Clculos en la vejiga, inflamacin o tumores. Diabetes. Debilidad de los msculos o nervios, especialmente a causa de estas afecciones: Lesin en la mdula espinal. Accidente cerebrovascular. Esclerosis mltiple. Enfermedad de Parkinson. Otras causas: Ciruga en el tero o la uretra. Consumir cafena o alcohol en exceso. Ciertos medicamentos, especialmente aquellos que eliminan el exceso de lquido del cuerpo  (diurticos). Estreimiento. Qu incrementa el riesgo? Puede correr un mayor riesgo de desarrollar vejiga hiperactiva si usted: Es Media planner. Fuma. Est atravesando la menopausia. Tiene problemas de prstata. Tiene una enfermedad neurolgica, como accidente cerebrovascular, demencia, enfermedad de Parkinson o esclerosis mltiple (EM). Come o toma alcohol, alimentos picantes, cafena y otras cosas que irritan la vejiga. Tiene sobrepeso u obesidad. Cules son los signos o sntomas? Los sntomas de esta afeccin incluyen una necesidad repentina y fuerte de Garment/textile technologist. Otros sntomas pueden incluir los siguientes: Prdida de [place]. Orina 8 o ms veces por da. Levantarse para orinar 2 o ms veces durante la noche. Cmo se diagnostica? Esta afeccin se puede diagnosticar en funcin de lo siguiente: Los sntomas y los antecedentes mdicos. Un examen fsico. Anlisis de sangre o de orina para detectar posibles causas, como una infeccin. Es posible que tambin tenga que Teacher, adult education a un mdico especialista en problemas de las vas [place]. Este mdico es Heritage manager. Cmo se trata? El tratamiento para el trastorno de vejiga hiperactiva depende de la causa y la gravedad de su enfermedad. El tratamiento puede incluir: Entrenamiento de la vejiga, por ejemplo: Aprender a controlar la necesidad urgente de Garment/textile technologist siguiendo un programa para orinar en intervalos regulares. Hacer ejercicios de Kegel para fortalecer los msculos del piso plvico que sostienen la vejiga. Dispositivos especiales, por ejemplo: Biorretroalimentacin. Utiliza sensores para ayudarlo a Personnel officer atento a las seales del cuerpo. Estimulacin elctrica. Utiliza electrodos que se colocan dentro del cuerpo (implantados) o fuera del cuerpo. Estos electrodos envan pulsos elctricos suaves para fortalecer los nervios o los msculos que controlan la vejiga. Las mujeres pueden utilizar un dispositivo de plstico, llamado pesario, que  calza en la vagina y sostiene la vejiga. Medicamentos, como por ejemplo: Antibiticos para tratar las infecciones en la vejiga. Antiespasmdicos para evitar que la vejiga elimine orina en el momento incorrecto. Antidepresivos tricclicos para relajar los msculos de la vejiga. Inyecciones de toxina botulnica tipo A directamente en el tejido de la vejiga para relajar los msculos de la vejiga. Ciruga, como: Puede implantarse un dispositivo para ayudar a Chief Technology Officer las seales nerviosas que controlan la miccin. Puede implantarse un electrodo para estimular las seales elctricas en la vejiga. Puede realizarse un procedimiento para cambiar la forma de la vejiga. Esto solo se Merchandiser, retail graves. Siga estas instrucciones en su casa: Comida y bebida  Haga cambios en su estilo de vida o dieta segn las recomendaciones de su mdico. Estos pueden incluir: Electronics engineer lquidos a lo May[place] y no solo con las comidas. Reducir la ingesta de cafena o alcohol. Ingerir una dieta saludable y equilibrada para English as a second language teacher estreimiento. Esto puede incluir: Elegir alimentos ricos en fibra, como frijoles, cereales integrales y frutas y verduras frescas. Limitar el consumo de alimentos ricos en grasas y azcares procesados, como alimentos fritos o dulces. Estilo de vida  Baje de [place], si es necesario. No consuma ningn producto que contenga nicotina o tabaco. Estos incluyen cigarrillos, tabaco para mascar y aparatos de vapeo, como los Psychologist, sport and exercise. Si necesita ayuda para dejar de consumir estos productos, consulte al mdico. Indicaciones generales Use los medicamentos de venta libre y los recetados solamente como se lo haya indicado el mdico. Si le recetaron un antibitico, tmelo como se lo haya indicado el mdico. No deje de tomar el antibitico aunque comience a sentirse mejor. Use los implantes o el pesario como se lo haya indicado el mdico. Si es necesario, use apsitos para  Tax adviser cualquier prdida de orina que pueda [place]. Lleve un diario para registrar la cantidad de lquidos que ingiere y cundo lo hace, y cundo Mining engineer. Esto ayudar a su mdico a Retail buyer. Cumpla con todas las visitas de seguimiento. Esto es importante. Comunquese con un mdico si: Tiene fiebre o escalofros. Los sntomas no mejoran con Dispensing optician. El dolor y [place]. Tiene necesidad urgente de orinar con mayor frecuencia. Solicite ayuda de inmediato si: No puede controlar la vejiga. Resumen Vejiga hiperactiva se refiere a Engineering geologist en la que la persona tiene una necesidad sbita y frecuente de Garment/textile technologist. Varias afecciones pueden causar sntomas de vejiga hiperactiva. El tratamiento para la vejiga hiperactiva depende de la causa y la gravedad de la afeccin. Hacer cambios en el estilo de vida, hacer ejercicios de Kegel, llevar un diario y tomar medicamentos puede ayudar con esta afeccin. Esta informacin no tiene Marine scientist el consejo del mdico. Asegrese de hacerle al mdico cualquier pregunta que tenga. Document Revised: 10/19/2020 Document Reviewed: 09/25/2020 Elsevier Patient Education  2022 [employer].

## 2022-03-18 ENCOUNTER — Other Ambulatory Visit: Payer: Self-pay

## 2022-03-18 ENCOUNTER — Ambulatory Visit (INDEPENDENT_AMBULATORY_CARE_PROVIDER_SITE_OTHER): Payer: Self-pay | Admitting: Obstetrics & Gynecology

## 2022-03-18 ENCOUNTER — Encounter: Payer: Self-pay | Admitting: Obstetrics & Gynecology

## 2022-03-18 VITALS — BP 120/76 | HR 74 | Resp 16 | Ht 61.25 in | Wt 183.0 lb

## 2022-03-18 DIAGNOSIS — Z90710 Acquired absence of both cervix and uterus: Secondary | ICD-10-CM

## 2022-03-18 DIAGNOSIS — Z01419 Encounter for gynecological examination (general) (routine) without abnormal findings: Secondary | ICD-10-CM

## 2022-03-18 DIAGNOSIS — Z6834 Body mass index (BMI) 34.0-34.9, adult: Secondary | ICD-10-CM

## 2022-03-18 DIAGNOSIS — E6609 Other obesity due to excess calories: Secondary | ICD-10-CM

## 2022-03-18 NOTE — Progress Notes (Signed)
? ? ?Madison Thompson [facility] 15-Nov-1971 [id number] ? ? ?History:    51 y.o. G3P3L3 ? ?RP:  Established patient presenting for annual gyn exam  ? ?HPI:  XI robotic TLH/bilateral salpingectomy on August 20, 2021.  Patho benign.  No pelvic pain.  No pain with IC.  Decrease in libido.  Breasts normal.  Last mammo Neg 12/2017, will schedule now.  Urine normal, less urinary urgency since stopped caffeine. BMI 34.3.  Walking.  Health labs with Fam MD.  Will schedule her first Colonoscopy. ?  ? ?Past medical history,surgical history, family history and social history were all reviewed and documented in the EPIC chart. ? ?Gynecologic History ?Patient's last menstrual period was 07/09/2021 (exact date). ? ?Obstetric History ?OB History  ?Gravida Para Term Preterm AB Living  ?'3 3 3     3  '$ ?SAB IAB Ectopic Multiple Live Births  ?        3  ?  ?# Outcome Date GA Lbr Len/2nd Weight Sex Delivery Anes PTL Lv  ?3 Term           ?2 Term           ?1 Term           ? ? ? ?ROS: A ROS was performed and pertinent positives and negatives are included in the history. ? GENERAL: No fevers or chills. HEENT: No change in vision, no earache, sore throat or sinus congestion. NECK: No pain or stiffness. CARDIOVASCULAR: No chest pain or pressure. No palpitations. PULMONARY: No shortness of breath, cough or wheeze. GASTROINTESTINAL: No abdominal pain, nausea, vomiting or diarrhea, melena or bright red blood per rectum. GENITOURINARY: No urinary frequency, urgency, hesitancy or dysuria. MUSCULOSKELETAL: No joint or muscle pain, no back pain, no recent trauma. DERMATOLOGIC: No rash, no itching, no lesions. ENDOCRINE: No polyuria, polydipsia, no heat or cold intolerance. No recent change in weight. HEMATOLOGICAL: No anemia or easy bruising or bleeding. NEUROLOGIC: No headache, seizures, numbness, tingling or weakness. PSYCHIATRIC: No depression, no loss of interest in normal activity or change in sleep pattern.  ?  ? ?Exam: ? ? ?BP 120/76   Pulse 74    Resp 16   Ht 5' 1.25" (1.556 m)   Wt 183 lb (83 kg)   LMP 07/09/2021 (Exact Date) Comment: btl  BMI 34.30 kg/m?  ? ?Body mass index is 34.3 kg/m?. ? ?General appearance : Well developed well nourished female. No acute distress ?HEENT: Eyes: no retinal hemorrhage or exudates,  Neck supple, trachea midline, no carotid bruits, no thyroidmegaly ?Lungs: Clear to auscultation, no rhonchi or wheezes, or rib retractions  ?Heart: Regular rate and rhythm, no murmurs or gallops ?Breast:Examined in sitting and supine position were symmetrical in appearance, no palpable masses or tenderness,  no skin retraction, no nipple inversion, no nipple discharge, no skin discoloration, no axillary or supraclavicular lymphadenopathy ?Abdomen: no palpable masses or tenderness, no rebound or guarding ?Extremities: no edema or skin discoloration or tenderness ? ?Pelvic: Vulva: Normal ?            Vagina: No gross lesions or discharge ? Cervix/Uterus absent ? Adnexa  Without masses or tenderness ? Anus: Normal ? ? ?Assessment/Plan:  51 y.o. female for annual exam  ? ?1. Well female exam with routine gynecological exam ? XI robotic TLH/bilateral salpingectomy on August 20, 2021.  Patho benign.  No pelvic pain.  No pain with IC.  Decrease in libido.  Breasts normal.  Last mammo Neg 12/2017, will schedule now.  Urine normal, less urinary urgency since stopped caffeine. BMI 34.3.  Walking.  Health labs with Fam MD.  Will schedule her first Colonoscopy. ?  ?2. S/P total hysterectomy ? ?3. Class 1 obesity due to excess calories without serious comorbidity with body mass index (BMI) of 34.0 to 34.9 in adult  ?Lower calorie/carb diet.  Increase fitness activities. ? ?Princess Bruins MD, 8:13 AM 03/18/2022 ? ?  ?

## 2022-09-08 ENCOUNTER — Emergency Department (HOSPITAL_COMMUNITY)
Admission: EM | Admit: 2022-09-08 | Discharge: 2022-09-09 | Disposition: A | Discharge status: Home / Self Care | Payer: No Typology Code available for payment source | Attending: Emergency Medicine | Admitting: Emergency Medicine

## 2022-09-08 ENCOUNTER — Encounter (HOSPITAL_COMMUNITY): Payer: Self-pay

## 2022-09-08 ENCOUNTER — Other Ambulatory Visit: Payer: Self-pay

## 2022-09-08 ENCOUNTER — Emergency Department (HOSPITAL_COMMUNITY): Payer: No Typology Code available for payment source

## 2022-09-08 DIAGNOSIS — Z8616 Personal history of COVID-19: Secondary | ICD-10-CM | POA: Insufficient documentation

## 2022-09-08 DIAGNOSIS — Z87891 Personal history of nicotine dependence: Secondary | ICD-10-CM | POA: Insufficient documentation

## 2022-09-08 DIAGNOSIS — M109 Gout, unspecified: Secondary | ICD-10-CM | POA: Insufficient documentation

## 2022-09-08 NOTE — ED Provider Triage Note (Signed)
Emergency Medicine Provider Triage Evaluation Note  Madison Thompson , a 51 y.o. female  was evaluated in triage.  Pt complains of right foot pain that started last night.  Denies trauma or injury or recent heavy activity.  She states pain is worsened into today and she has some swelling and worsening tenderness of the distal medial aspect of her right foot.  Pain does not radiate up into her calf.  Denies previous history of injury to the right foot.  No previous history of DVT.  She has some numbness and tingling of the great toe.  Review of Systems  Positive:  Negative:   Physical Exam  BP (!) 137/90 (BP Location: Right Arm)   Pulse 76   Temp 99 F (37.2 C) (Oral)   Resp 16   Ht '5\' 1"'$  (1.549 m)   Wt 83 kg   LMP 07/09/2021 (Exact Date) Comment: btl  SpO2 100%   BMI 34.58 kg/m  Gen:   Awake, no distress   Resp:  Normal effort  MSK:   Moves extremities without difficulty; tender to palpation of the right medial foot with evidence of early bruising and some swelling compared to left.  2+ DP pulses.  Brisk capillary refill.  No posterior calf tenderness Other:    Medical Decision Making  Medically screening exam initiated at 6:37 PM.  Appropriate orders placed.  Shea Evans [place] was informed that the remainder of the evaluation will be completed by another provider, this initial triage assessment does not replace that evaluation, and the importance of remaining in the ED until their evaluation is complete.     Tonye Pearson, [place] 09/08/22 1839

## 2022-09-08 NOTE — ED Triage Notes (Signed)
Patient reports pain in left foot that started last night reports no injury.

## 2022-09-09 MED ORDER — PREDNISONE 20 MG PO TABS: 40.0000 mg | ORAL_TABLET | Freq: Every day | ORAL | 0 refills | Status: AC

## 2022-09-09 MED ORDER — HYDROCODONE-ACETAMINOPHEN 5-325 MG PO TABS
1.0000 | ORAL_TABLET | Freq: Once | ORAL | Status: AC
  Administered 2022-09-09: 1 via ORAL
  Filled 2022-09-09: qty 1

## 2022-09-09 MED ORDER — PREDNISONE 20 MG PO TABS
60.0000 mg | ORAL_TABLET | Freq: Once | ORAL | Status: AC
  Administered 2022-09-09: 60 mg via ORAL
  Filled 2022-09-09: qty 3

## 2022-09-09 MED ORDER — HYDROCODONE-ACETAMINOPHEN 5-325 MG PO TABS: 1.0000 | ORAL_TABLET | ORAL | 0 refills | Status: DC | PRN

## 2022-09-09 NOTE — ED Notes (Signed)
Patient verbalizes understanding of discharge instructions. Opportunity for questioning and answers were provided. Armband removed by staff, pt discharged from ED.  

## 2022-09-09 NOTE — ED Provider Notes (Signed)
[facility] Emergency Department Provider Note MRN:  [id number]  Arrival date & time: 09/09/22     Chief Complaint   Leg Pain   History of Present Illness   Madison Thompson is a 51 y.o. year-old female with a history of hysterectomy presenting to the ED with chief complaint of leg pain.  Pain in the base of the right great toe for the past day or 2.  Becoming very painful, worse with any movement.  No trauma, no fever, no other complaints.  Review of Systems  A thorough review of systems was obtained and all systems are negative except as noted in the HPI and PMH.   Patient's Health History    Past Medical History:  Diagnosis Date   Anemia    COVID 2021   migraine, loss of appetite  vomiting x 3 weeks all symptoms resolved   COVID 06/21/2021   asymptomatic   GERD (gastroesophageal reflux disease) 06/18/2021   Migraine 06/18/2021   Uterine leiomyoma 06/18/2021   Wears partial dentures 06/18/2021   lower    Past Surgical History:  Procedure Laterality Date   ROBOTIC ASSISTED LAPAROSCOPIC HYSTERECTOMY AND SALPINGECTOMY N/A 08/20/2021   Procedure: XI ROBOTIC ASSISTED LAPAROSCOPIC HYSTERECTOMY AND Bilateral SALPINGECTOMY (Bag/No morcellation);  Surgeon: Princess Bruins, MD;  Location: [facility];  Service: Gynecology;  Laterality: N/A;   TUBAL LIGATION     yrs ago per pt on 06-18-2021    Family History  Problem Relation Age of Onset   Diabetes Sister    Heart disease Brother     Social History   Socioeconomic History   Marital status: Single    Spouse name: Not on file   Number of children: Not on file   Years of education: Not on file   Highest education level: Not on file  Occupational History   Not on file  Tobacco Use   Smoking status: Former    Types: Cigarettes   Smokeless tobacco: Never  Vaping Use   Vaping Use: Never used  Substance and Sexual Activity   Alcohol use: No   Drug use: No   Sexual activity:  Yes    Partners: Male    Birth control/protection: Surgical    Comment: 1st intercourse- 15 , btl, hysterectomy  Other Topics Concern   Not on file  Social History Narrative   Not on file   Social Determinants of Health   Financial Resource Strain: Not on file  Food Insecurity: Not on file  Transportation Needs: Not on file  Physical Activity: Inactive (01/26/2018)   Exercise Vital Sign    Days of Exercise per Week: 0 days    Minutes of Exercise per Session: 0 min  Stress: Stress Concern Present (01/26/2018)   [place]    Feeling of Stress : Rather much  Social Connections: Somewhat Isolated (01/26/2018)   Social Connection and Isolation Panel [NHANES]    Frequency of Communication with Friends and Family: More than three times a week    Frequency of Social Gatherings with Friends and Family: Never    Attends Religious Services: Never    Marine scientist or Organizations: No    Attends Archivist Meetings: Never    Marital Status: Living with partner  Intimate Partner Violence: Not At Risk (01/26/2018)   Humiliation, Afraid, Rape, and Kick questionnaire    Fear of Current or Ex-Partner: No    Emotionally Abused: No  Physically Abused: No    Sexually Abused: No     Physical Exam   Vitals:   09/08/22 1804 09/08/22 1928  BP: (!) 137/90 127/79  Pulse: 76 78  Resp: 16 18  Temp: 99 F (37.2 C) 98 F (36.7 C)  SpO2: 100% 98%    CONSTITUTIONAL: Well-appearing, NAD NEURO/PSYCH:  Alert and oriented x 3, no focal deficits EYES:  eyes equal and reactive ENT/NECK:  no LAD, no JVD CARDIO: Regular rate, well-perfused, normal S1 and S2 PULM:  CTAB no wheezing or rhonchi GI/GU:  non-distended, non-tender MSK/SPINE: Focal swelling, mild erythema, exquisite tenderness to the right first MTP SKIN:  no rash, atraumatic   *Additional and/or pertinent findings included in MDM below  Diagnostic and  Interventional Summary    EKG Interpretation  Date/Time:    Ventricular Rate:    PR Interval:    QRS Duration:   QT Interval:    QTC Calculation:   R Axis:     Text Interpretation:         Labs Reviewed - No data to display  DG Foot Complete Right  Final Result      Medications  predniSONE (DELTASONE) tablet 60 mg (has no administration in time range)  HYDROcodone-acetaminophen (NORCO/VICODIN) 5-325 MG per tablet 1 tablet (has no administration in time range)     Procedures  /  Critical Care Procedures  ED Course and Medical Decision Making  Initial Impression and Ddx Exam is highly suspicious for acute gout.  Septic joint is felt to be much less likely.  No fever, the exam seems more inflammatory rather than infectious.  Strict return precautions for symptoms not improving on steroid burst, appropriate for discharge.  Past medical/surgical history that increases complexity of ED encounter: None  Interpretation of Diagnostics I personally reviewed the foot x-ray and my interpretation is as follows: No obvious bony abnormality    Patient Reassessment and Ultimate Disposition/Management     Discharge  Patient management required discussion with the following services or consulting groups:  None  Complexity of Problems Addressed Acute illness or injury that poses threat of life of bodily function  Additional Data Reviewed and Analyzed Further history obtained from: Further history from spouse/family member  Additional Factors Impacting ED Encounter Risk Prescriptions  [name], [place] [email or web address]  Final Clinical Impressions(s) / ED Diagnoses     ICD-10-CM   1. Acute gout involving toe of right foot, unspecified cause  M10.9       ED Discharge Orders          Ordered    predniSONE (DELTASONE) 20 MG tablet  Daily        09/09/22 0003    HYDROcodone-acetaminophen (NORCO/VICODIN)  5-325 MG tablet  Every 4 hours PRN        09/09/22 0003             Discharge Instructions Discussed with and Provided to Patient:    Discharge Instructions      Fue evaluado en el Departamento de Emergencias y despus de una evaluacin cuidadosa, no encontramos ninguna condicin emergente que requiriera ingreso o pruebas adicionales en el hospital.  Su examen/prueba de hoy fue en general tranquilizador. Los sntomas parecen deberse a Best boy. [place] el medicamento esteroide prednisona diariamente segn las indicaciones a partir de la maana del 9 al 13. Puede usar el analgsico Norco segn sea necesario para un dolor ms intenso.  [place]  de Emergencias si experimenta algn empeoramiento de su condicin. Gracias por permitirnos ser parte de su cuidado.       Maudie Flakes, MD 09/09/22 [phone]

## 2022-09-09 NOTE — Discharge Instructions (Addendum)
Fue evaluado en el [place] de Emergencias y despus de una evaluacin cuidadosa, no encontramos ninguna condicin emergente que requiriera ingreso o pruebas adicionales en el hospital.  Su examen/prueba de hoy fue en general tranquilizador. Los sntomas parecen deberse a Best boy. [place] el medicamento esteroide prednisona diariamente segn las indicaciones a partir de la maana del 9 al 13. Puede usar el analgsico Norco segn sea necesario para un dolor ms intenso.  [place] al [place] de Emergencias si experimenta algn empeoramiento de su condicin. Gracias por permitirnos ser parte de su cuidado.

## 2022-10-25 IMAGING — MR MR PELVIS WO/W CM
18 series · 48 of 48 positions shown · IV contrast (multihance)
Comparison: None.

CLINICAL DATA: Pelvic mass on ultrasound.

EXAM:
MRI PELVIS WITHOUT AND WITH CONTRAST
TECHNIQUE: Multiplanar multisequence MR imaging of the pelvis was performed
both before and after administration of intravenous contrast.
CONTRAST:  16mL MULTIHANCE GADOBENATE DIMEGLUMINE 529 MG/ML IV SOLN

[Series 3: cor haste · coronal · 6.0mm · 0.74mm/px · 2 of 30 slices shown]
[im 1/30]
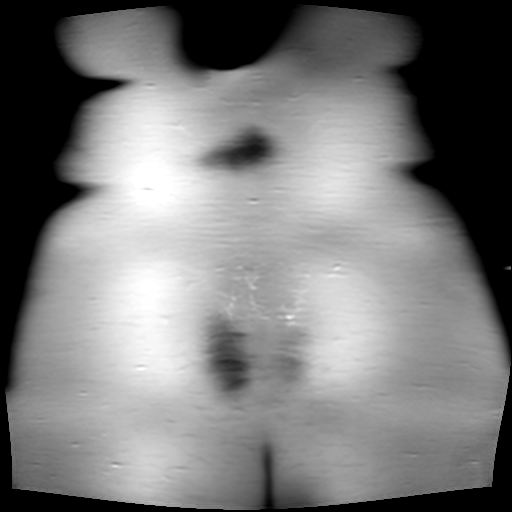
[im 30/30]
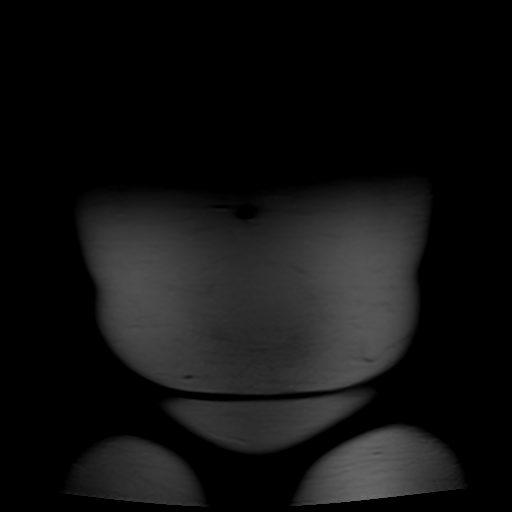

[Series 4: T2 · axial · 5.0mm · 0.94mm/px · z∈[-79,+137]mm · 2 of 37 slices shown]
[im 1/37]
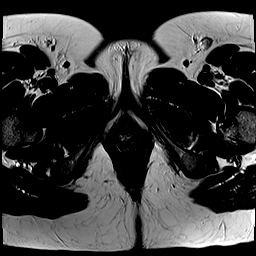
[im 37/37]
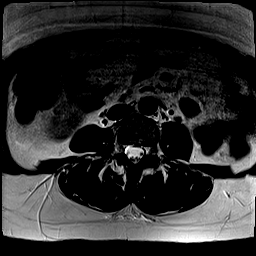

[Series 5: t2_tse axial fs · axial · 5.0mm · 0.94mm/px · z∈[-73,+131]mm · 2 of 35 slices shown]
[im 1/35]
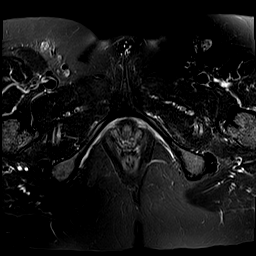
[im 35/35]
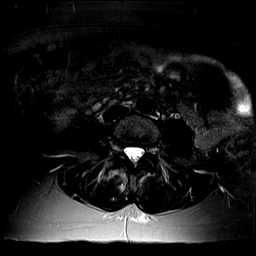

[Series 6: t2_tse_sag · sagittal · 5.0mm · 0.94mm/px · 2 of 31 slices shown]
[im 1/31]
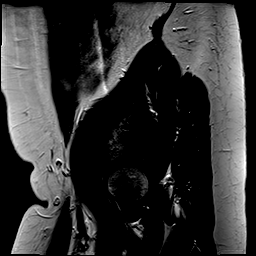
[im 31/31]
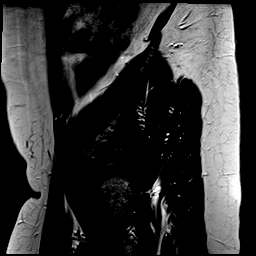

[Series 8: T1 · axial · 5.0mm · 0.55mm/px · z∈[-85,+129]mm · 4 of 80 slices shown]
[im 1/80]
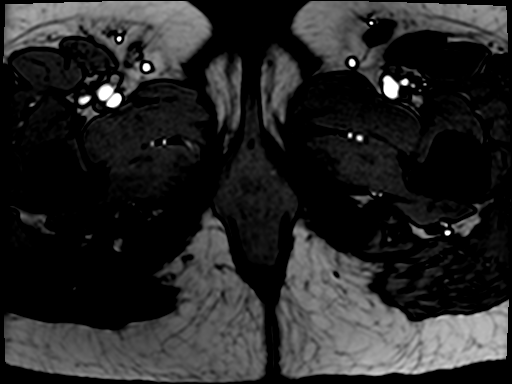
[im 27/80]
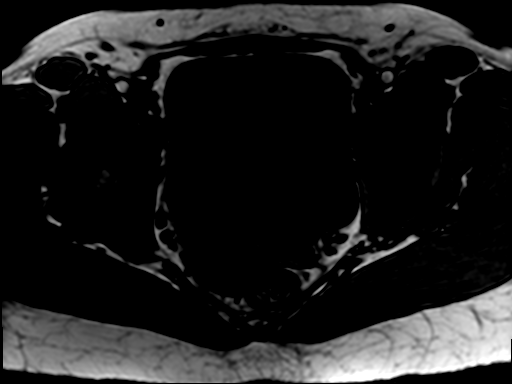
[im 53/80]
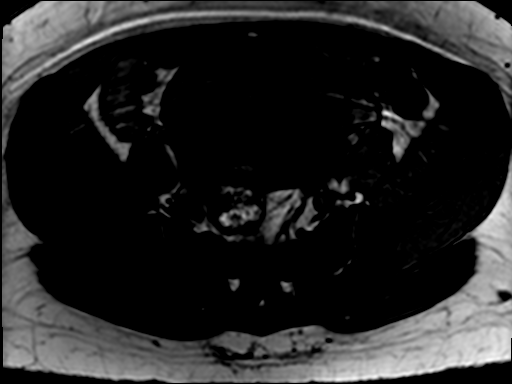
[im 80/80]
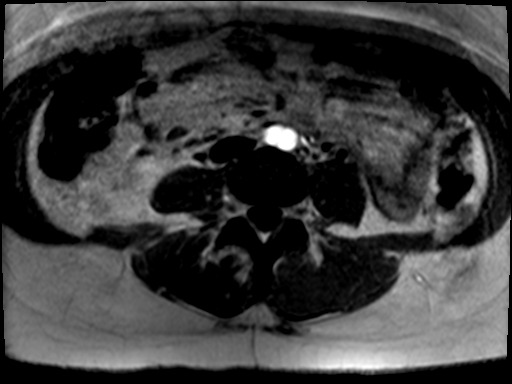

[Series 9: DWI · axial · 6.0mm · 1.82mm/px · z∈[-90,+133]mm · 4 of 96 slices shown]
[im 1/96]
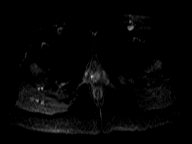
[im 32/96]
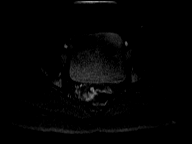
[im 64/96]
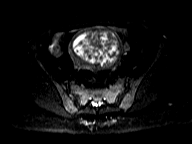
[im 96/96]
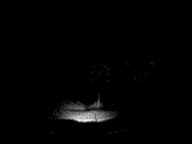

[Series 10: axial dwi_adc · axial · 6.0mm · 1.82mm/px · 1 of 32 slices shown]
[im 1/32]
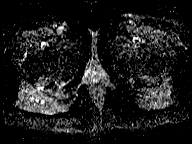

[Series 11: T1 dynamic · axial · non-contrast · 2.3mm · 1.37mm/px · z∈[-78,+122]mm · 3 of 88 slices shown]
[im 1/88]
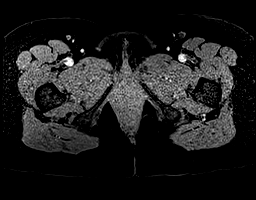
[im 44/88]
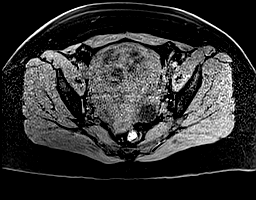
[im 88/88]
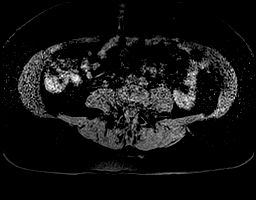

[Series 12: ax post dynamic · axial · 2.3mm · 1.37mm/px · z∈[-78,+122]mm · 3 of 88 slices shown (1 of 2)]
[im 1/88]
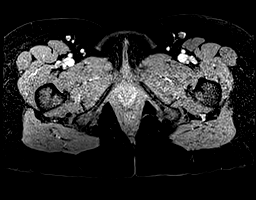
[im 44/88]
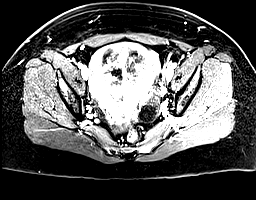
[im 88/88]
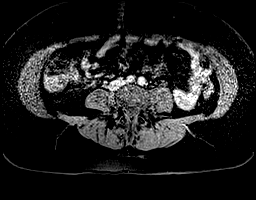

[Series 13: ax post dynamic · axial · 2.3mm · 1.37mm/px · z∈[-78,+122]mm · 3 of 88 slices shown (2 of 2)]
[im 1/88]
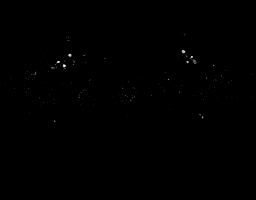
[im 44/88]
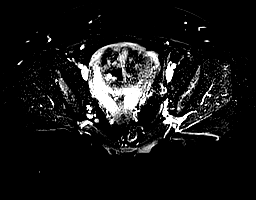
[im 88/88]
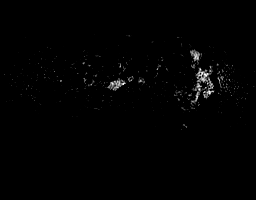

[Series 14: ax post 1 · axial · 2.3mm · 1.37mm/px · z∈[-78,+122]mm · 3 of 88 slices shown (1 of 2)]
[im 1/88]
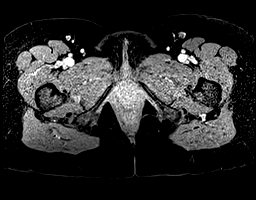
[im 44/88]
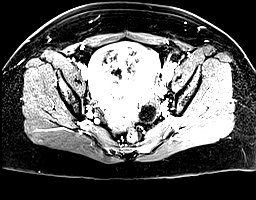
[im 88/88]
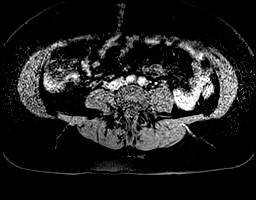

[Series 15: ax post 1 · axial · 2.3mm · 1.37mm/px · z∈[-78,+122]mm · 3 of 88 slices shown (2 of 2)]
[im 1/88]
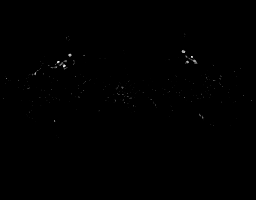
[im 44/88]
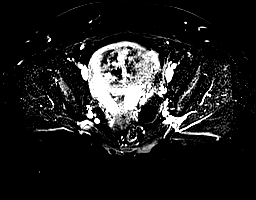
[im 88/88]
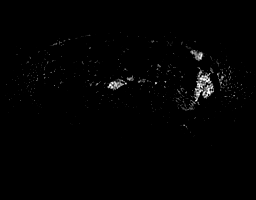

[Series 16: ax post 2 · axial · 2.3mm · 1.37mm/px · z∈[-78,+122]mm · 3 of 88 slices shown (1 of 2)]
[im 1/88]
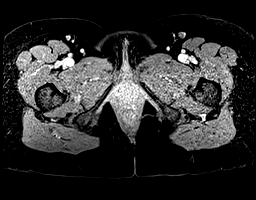
[im 44/88]
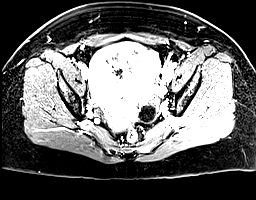
[im 88/88]
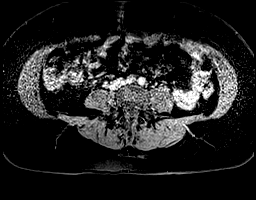

[Series 17: ax post 2 · axial · 2.3mm · 1.37mm/px · z∈[-78,+122]mm · 3 of 88 slices shown (2 of 2)]
[im 1/88]
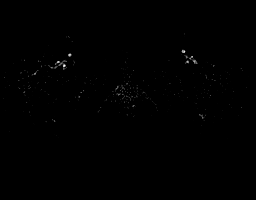
[im 44/88]
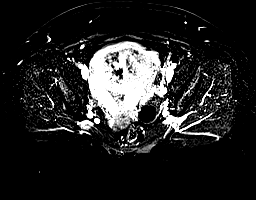
[im 88/88]
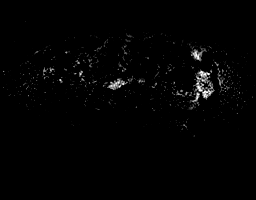

[Series 18: ax post 3+ · axial · 2.3mm · 1.37mm/px · z∈[-78,+122]mm · 3 of 88 slices shown (1 of 2)]
[im 1/88]
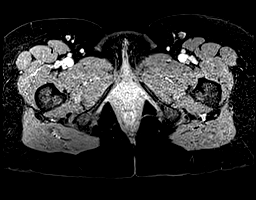
[im 44/88]
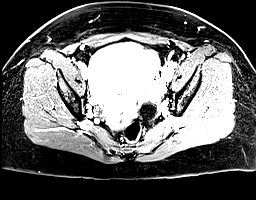
[im 88/88]
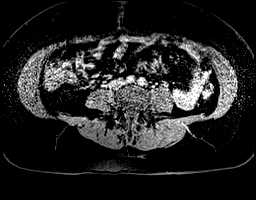

[Series 19: ax post 3+ · axial · 2.3mm · 1.37mm/px · z∈[-78,+122]mm · 3 of 88 slices shown (2 of 2)]
[im 1/88]
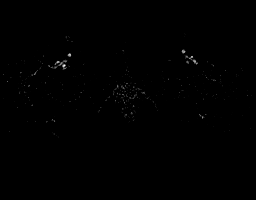
[im 44/88]
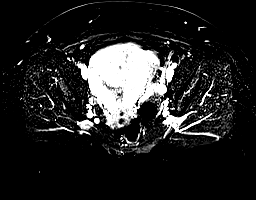
[im 88/88]
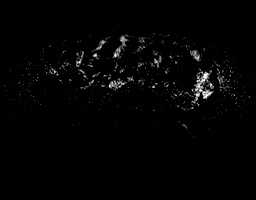

[Series 20: T1 dynamic post-contrast · sagittal · 2.5mm · 0.68mm/px · 3 of 64 slices shown]
[im 1/64]
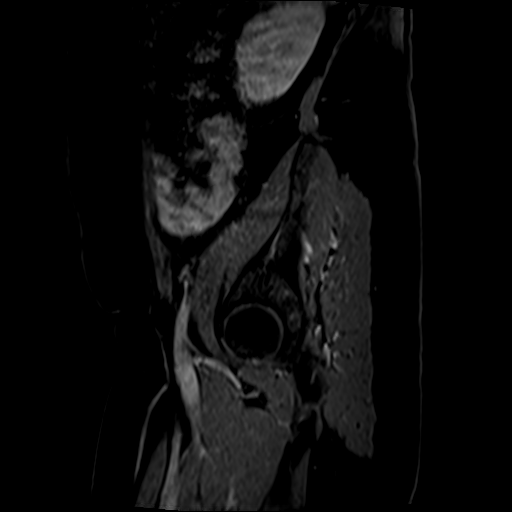
[im 32/64]
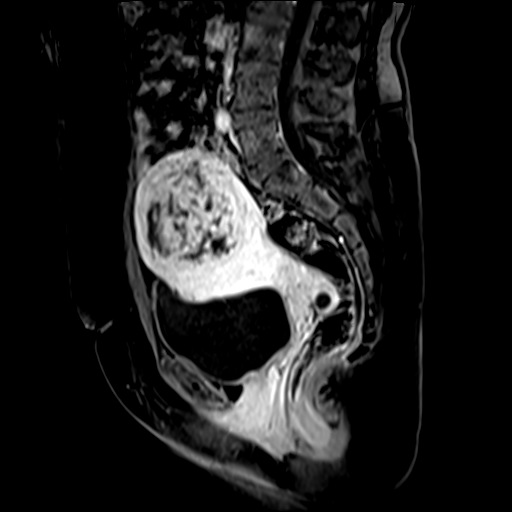
[im 64/64]
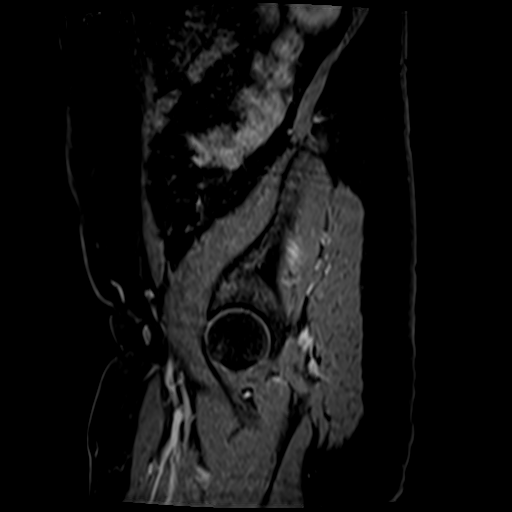

[Series 21: sag t2_post · sagittal · 5.0mm · 0.94mm/px · 1 of 25 slices shown]
[im 1/25]
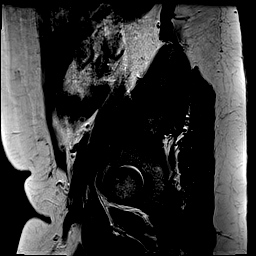

[48 of 48 positions shown; findings below may reference images not displayed]

FINDINGS: Urinary Tract: The bladder is unremarkable. No bladder mass,
asymmetric bladder wall thickening or calculi.

Bowel: The rectum, sigmoid colon and visualized small-bowel loops
are unremarkable.

Vascular/Lymphatic: The major vascular structures are unremarkable.
No aneurysm dissection. No pelvic or inguinal lymphadenopathy the.

Reproductive: The uterus is enlarged measuring approximately 15 x 9
x 10 cm. This is mainly due to a large exophytic lesion projecting
off the posterior fundal region of the uterus. This lesion measures
10 x 7.5 x 7.5 cm. It has very heterogeneous areas of low T2 and
high T2 signal intensity. The areas of low T2 signal intensity
demonstrate subsequent contrast enhancement. This lesion is
diffusion negative and I think it is most likely a severely
degenerated fibroid. Could not totally exclude the possibility of a
leiomyosarcoma and a biopsy or excision may be indicated. I do not
have any prior imaging studies to demonstrate its chronicity but
that may be helpful if the patient has had prior CT or ultrasound
examinations to show this has a chronic/stable finding.

Both ovaries appear normal. There is a simple appearing 2.5 cm cyst
associated with the left ovary. No follow-up imaging recommended.
Note: This recommendation does not apply to premenarchal patients
and to those with increased risk (genetic, family history, elevated
tumor markers or other high-risk factors) of ovarian cancer.
Reference: JACR [DATE]):[id number].

Numerous nabothian cysts are noted around the cervix.

Other: No free pelvic fluid collections are identified. No inguinal
adenopathy.

Musculoskeletal: No significant bony findings.
IMPRESSION: 1. 10 x 7.5 x 7.5 cm exophytic lesion projecting off the posterior
fundal region of the uterus. This is most likely a severely
degenerated fibroid. Could not totally exclude the possibility of a
leiomyosarcoma and a biopsy or excision may be indicated. Comparison
with any prior imaging studies may also be helpful as detailed
above.
2. Normal ovaries.
3. Numerous nabothian cysts around the cervix.

## 2023-03-20 ENCOUNTER — Ambulatory Visit (INDEPENDENT_AMBULATORY_CARE_PROVIDER_SITE_OTHER): Payer: Self-pay | Admitting: Obstetrics & Gynecology

## 2023-03-20 ENCOUNTER — Encounter: Payer: Self-pay | Admitting: Obstetrics & Gynecology

## 2023-03-20 VITALS — BP 110/76 | HR 73 | Ht 61.25 in | Wt 178.0 lb

## 2023-03-20 DIAGNOSIS — Z6833 Body mass index (BMI) 33.0-33.9, adult: Secondary | ICD-10-CM

## 2023-03-20 DIAGNOSIS — Z01419 Encounter for gynecological examination (general) (routine) without abnormal findings: Secondary | ICD-10-CM

## 2023-03-20 DIAGNOSIS — E6609 Other obesity due to excess calories: Secondary | ICD-10-CM

## 2023-03-20 DIAGNOSIS — Z90710 Acquired absence of both cervix and uterus: Secondary | ICD-10-CM

## 2023-03-20 NOTE — Progress Notes (Signed)
Madison Thompson 10-20-1971 [id number]   History:    52 y.o.  G3P3L3 Stable boyfriend   RP:  Established patient presenting for annual gyn exam    HPI:  XI robotic TLH/bilateral salpingectomy on August 20, 2021.  Patho benign.  No pelvic pain.  No pain with IC.  No h/o abnormal Pap.  Will repeat Pap at 4 yrs from [facility]. Breasts normal.  Last mammo Neg 12/2017, will schedule at the [facility].  Urine/BMs normal. BMI 33.36.  Walking.  Will schedule her first Colonoscopy.  F/U fasting Health labs.   Past medical history,surgical history, family history and social history were all reviewed and documented in the EPIC chart.  Gynecologic History Patient's last menstrual period was 07/09/2021 (exact date).  Obstetric History OB History  Gravida Para Term Preterm AB Living  3 3 3     3   SAB IAB Ectopic Multiple Live Births          3    # Outcome Date GA Lbr Len/2nd Weight Sex Delivery Anes PTL Lv  3 Term           2 Term           1 Term              ROS: A ROS was performed and pertinent positives and negatives are included in the history. GENERAL: No fevers or chills. HEENT: No change in vision, no earache, sore throat or sinus congestion. NECK: No pain or stiffness. CARDIOVASCULAR: No chest pain or pressure. No palpitations. PULMONARY: No shortness of breath, cough or wheeze. GASTROINTESTINAL: No abdominal pain, nausea, vomiting or diarrhea, melena or bright red blood per rectum. GENITOURINARY: No urinary frequency, urgency, hesitancy or dysuria. MUSCULOSKELETAL: No joint or muscle pain, no back pain, no recent trauma. DERMATOLOGIC: No rash, no itching, no lesions. ENDOCRINE: No polyuria, polydipsia, no heat or cold intolerance. No recent change in weight. HEMATOLOGICAL: No anemia or easy bruising or bleeding. NEUROLOGIC: No headache, seizures, numbness, tingling or weakness. PSYCHIATRIC: No depression, no loss of interest in normal activity or change in sleep pattern.      Exam:   BP 110/76   Pulse 73   Ht 5' 1.25" (1.556 m)   Wt 178 lb (80.7 kg)   LMP 07/09/2021 (Exact Date) Comment: btl, sexually active  SpO2 99%   BMI 33.36 kg/m   Body mass index is 33.36 kg/m.  General appearance : Well developed well nourished female. No acute distress HEENT: Eyes: no retinal hemorrhage or exudates,  Neck supple, trachea midline, no carotid bruits, no thyroidmegaly Lungs: Clear to auscultation, no rhonchi or wheezes, or rib retractions  Heart: Regular rate and rhythm, no murmurs or gallops Breast:Examined in sitting and supine position were symmetrical in appearance, no palpable masses or tenderness,  no skin retraction, no nipple inversion, no nipple discharge, no skin discoloration, no axillary or supraclavicular lymphadenopathy Abdomen: no palpable masses or tenderness, no rebound or guarding Extremities: no edema or skin discoloration or tenderness  Pelvic: Vulva: Normal             Vagina: No gross lesions or discharge  Cervix/Uterus absent  Adnexa  Without masses or tenderness  Anus: Normal   Assessment/Plan:  52 y.o. female for annual exam   1. Well female exam with routine gynecological exam XI robotic TLH/bilateral salpingectomy on August 20, 2021.  Patho benign.  No pelvic pain.  No pain with IC.  No h/o abnormal Pap.  Will  repeat Pap at 4 yrs from [facility]. Breasts normal.  Last mammo Neg 12/2017, will schedule at the [facility].  Urine/BMs normal. BMI 33.36.  Walking.  Will schedule her first Colonoscopy.  F/U fasting Health labs. - CBC; Future - Comp Met (CMET); Future - Lipid Profile; Future - TSH; Future - Vitamin D (25 hydroxy); Future  2. S/P total hysterectomy  3. Class 1 obesity due to excess calories without serious comorbidity with body mass index (BMI) of 33.0 to 33.9 in adult  BMI 33.36.  Increase walking.  Lower calorie/carb diet.  Princess Bruins MD, 8:14 AM

## 2023-03-23 ENCOUNTER — Other Ambulatory Visit: Payer: Self-pay

## 2023-03-23 DIAGNOSIS — Z01419 Encounter for gynecological examination (general) (routine) without abnormal findings: Secondary | ICD-10-CM

## 2023-03-23 DIAGNOSIS — E559 Vitamin D deficiency, unspecified: Secondary | ICD-10-CM

## 2023-03-23 DIAGNOSIS — R7989 Other specified abnormal findings of blood chemistry: Secondary | ICD-10-CM

## 2023-03-23 DIAGNOSIS — R7309 Other abnormal glucose: Secondary | ICD-10-CM

## 2023-03-23 DIAGNOSIS — E7849 Other hyperlipidemia: Secondary | ICD-10-CM

## 2023-03-24 ENCOUNTER — Telehealth: Payer: Self-pay

## 2023-03-24 ENCOUNTER — Other Ambulatory Visit: Payer: Self-pay

## 2023-03-24 LAB — COMPREHENSIVE METABOLIC PANEL
AG Ratio: 1.5 (calc) (ref 1.0–2.5)
ALT: 48 U/L — ABNORMAL HIGH (ref 6–29)
AST: 45 U/L — ABNORMAL HIGH (ref 10–35)
Albumin: 4.2 g/dL (ref 3.6–5.1)
Alkaline phosphatase (APISO): 89 U/L (ref 37–153)
BUN: 9 mg/dL (ref 7–25)
CO2: 26 mmol/L (ref 20–32)
Calcium: 9.1 mg/dL (ref 8.6–10.4)
Chloride: 106 mmol/L (ref 98–110)
Creat: 0.5 mg/dL (ref 0.50–1.03)
Globulin: 2.8 g/dL (calc) (ref 1.9–3.7)
Glucose, Bld: 127 mg/dL — ABNORMAL HIGH (ref 65–99)
Potassium: 4.3 mmol/L (ref 3.5–5.3)
Sodium: 142 mmol/L (ref 135–146)
Total Bilirubin: 0.5 mg/dL (ref 0.2–1.2)
Total Protein: 7 g/dL (ref 6.1–8.1)

## 2023-03-24 LAB — LIPID PANEL
Cholesterol: 149 mg/dL (ref <200)
HDL: 34 mg/dL — ABNORMAL LOW (ref >=50)
LDL Cholesterol (Calc): 84 mg/dL (calc)
Non-HDL Cholesterol (Calc): 115 mg/dL (calc) (ref <130)
Total CHOL/HDL Ratio: 4.4 (calc) (ref <5.0)
Triglycerides: 217 mg/dL — ABNORMAL HIGH (ref <150)

## 2023-03-24 LAB — CBC
HCT: 39.3 % (ref 35.0–45.0)
Hemoglobin: 13.2 g/dL (ref 11.7–15.5)
MCH: 30.1 pg (ref 27.0–33.0)
MCHC: 33.6 g/dL (ref 32.0–36.0)
MCV: 89.7 fL (ref 80.0–100.0)
MPV: 10.9 fL (ref 7.5–12.5)
Platelets: 336 10*3/uL (ref 140–400)
RBC: 4.38 10*6/uL (ref 3.80–5.10)
RDW: 12.1 % (ref 11.0–15.0)
WBC: 8 10*3/uL (ref 3.8–10.8)

## 2023-03-24 LAB — VITAMIN D 25 HYDROXY (VIT D DEFICIENCY, FRACTURES): Vit D, 25-Hydroxy: 10 ng/mL — ABNORMAL LOW (ref 30–100)

## 2023-03-24 LAB — TSH: TSH: 5.1 mIU/L — ABNORMAL HIGH

## 2023-03-24 MED ORDER — VITAMIN D (ERGOCALCIFEROL) 1.25 MG (50000 UNIT) PO CAPS: 50000.0000 [IU] | ORAL_CAPSULE | ORAL | 0 refills | Status: AC

## 2023-03-24 NOTE — Telephone Encounter (Signed)
  Madison Bruins, MD 03/24/2023  8:24 AM EDT     Patient needs to establish urgently with a Fam MD or Internist to manage many abnormal results.

## 2023-03-24 NOTE — Telephone Encounter (Signed)
Ambulatory Referral placed In Epic to [facility] on [facility].  Ideally patient would like Spanish speaking provider.  Routed to [facility] to hopefully expedite appointment.

## 2024-01-13 ENCOUNTER — Other Ambulatory Visit: Payer: Self-pay | Admitting: Obstetrics and Gynecology

## 2024-01-13 DIAGNOSIS — Z1231 Encounter for screening mammogram for malignant neoplasm of breast: Secondary | ICD-10-CM

## 2024-02-04 ENCOUNTER — Ambulatory Visit: Payer: No Typology Code available for payment source | Admitting: Emergency Medicine

## 2024-03-10 ENCOUNTER — Ambulatory Visit
Admission: RE | Admit: 2024-03-10 | Discharge: 2024-03-10 | Disposition: A | Discharge status: Home / Self Care | Payer: No Typology Code available for payment source | Source: Ambulatory Visit | Attending: Obstetrics and Gynecology | Admitting: Obstetrics and Gynecology

## 2024-03-10 ENCOUNTER — Other Ambulatory Visit: Payer: Self-pay

## 2024-03-10 ENCOUNTER — Ambulatory Visit: Payer: Self-pay | Admitting: Hematology and Oncology

## 2024-03-10 VITALS — BP 131/80 | Wt 180.0 lb

## 2024-03-10 DIAGNOSIS — Z1231 Encounter for screening mammogram for malignant neoplasm of breast: Secondary | ICD-10-CM

## 2024-03-10 DIAGNOSIS — Z1211 Encounter for screening for malignant neoplasm of colon: Secondary | ICD-10-CM

## 2024-03-10 NOTE — Patient Instructions (Signed)
 Taught Madison Thompson about self breast awareness and gave educational materials to take home. Patient did not need a Pap smear today due to hysterectomy. Referred patient to the [facility] for screening mammogram. Appointment scheduled for 03/10/2024. Patient aware of appointment and will be there. Let patient know will follow up with her within the next couple weeks with results. Madison Thompson verbalized understanding.  Pascal Lux, NP 10:44 AM

## 2024-03-10 NOTE — Progress Notes (Signed)
 Ms. Madison Thompson is a 53 y.o. female who presents to [facility] clinic today with no complaints.    Pap Smear: Pap not smear completed today due to benign hysterectomy.   Physical exam: Breasts Breasts symmetrical. No skin abnormalities bilateral breasts. No nipple retraction bilateral breasts. No nipple discharge bilateral breasts. No lymphadenopathy. No lumps palpated bilateral breasts.       Pelvic/Bimanual Pap is not indicated today    Smoking History: Patient has is a former smoker and was not referred to quit line.    Patient Navigation: Patient education provided. Access to services provided for patient through [facility] program. [name] interpreter provided. No transportation provided   Colorectal Cancer Screening: Per patient has never had colonoscopy completed No complaints today. FIT test given.    Breast and Cervical Cancer Risk Assessment: Patient does not have family history of breast cancer, known genetic mutations, or radiation treatment to the chest before age 48. Patient does not have history of cervical dysplasia, immunocompromised, or DES exposure in-utero.  Risk Scores as of Encounter on 03/10/2024     Madison Thompson           5-year 0.46%   Lifetime 3.98%            Last calculated by Caprice Red, CMA on 03/10/2024 at 10:27 AM        A: [facility] exam without pap smear No complaints with benign exam.   P: Referred patient to the [facility] for a screening mammogram. Appointment scheduled 03/10/2024.  Pascal Lux, NP 03/10/2024 10:43 AM

## 2024-03-16 ENCOUNTER — Other Ambulatory Visit: Payer: Self-pay | Admitting: Obstetrics and Gynecology

## 2024-03-16 DIAGNOSIS — R928 Other abnormal and inconclusive findings on diagnostic imaging of breast: Secondary | ICD-10-CM

## 2024-03-23 ENCOUNTER — Other Ambulatory Visit: Payer: Self-pay | Admitting: Obstetrics and Gynecology

## 2024-03-23 ENCOUNTER — Ambulatory Visit
Admission: RE | Admit: 2024-03-23 | Discharge: 2024-03-23 | Disposition: A | Discharge status: Home / Self Care | Source: Ambulatory Visit | Attending: Obstetrics and Gynecology | Admitting: Obstetrics and Gynecology

## 2024-03-23 DIAGNOSIS — R928 Other abnormal and inconclusive findings on diagnostic imaging of breast: Secondary | ICD-10-CM

## 2024-03-28 ENCOUNTER — Ambulatory Visit
Admission: RE | Admit: 2024-03-28 | Discharge: 2024-03-28 | Disposition: A | Discharge status: Home / Self Care | Source: Ambulatory Visit | Attending: Obstetrics and Gynecology | Admitting: Obstetrics and Gynecology

## 2024-03-28 DIAGNOSIS — R928 Other abnormal and inconclusive findings on diagnostic imaging of breast: Secondary | ICD-10-CM

## 2024-03-28 HISTORY — PX: BREAST BIOPSY: SHX20

## 2024-03-29 LAB — SURGICAL PATHOLOGY
